# Patient Record
Sex: Female | Born: 1980
Health system: Southern US, Community
[De-identification: ages and names within clinical notes are randomized; demographics above are authoritative.]

## PROBLEM LIST (undated history)

## (undated) DIAGNOSIS — N946 Dysmenorrhea, unspecified: Secondary | ICD-10-CM

## (undated) DIAGNOSIS — E669 Obesity, unspecified: Secondary | ICD-10-CM

## (undated) DIAGNOSIS — M419 Scoliosis, unspecified: Secondary | ICD-10-CM

## (undated) DIAGNOSIS — F419 Anxiety disorder, unspecified: Secondary | ICD-10-CM

## (undated) DIAGNOSIS — E782 Mixed hyperlipidemia: Secondary | ICD-10-CM

## (undated) DIAGNOSIS — R519 Headache, unspecified: Secondary | ICD-10-CM

## (undated) DIAGNOSIS — F32A Depression, unspecified: Secondary | ICD-10-CM

## (undated) HISTORY — PX: BACK SURGERY: SHX140

## (undated) HISTORY — PX: OTHER SURGICAL HISTORY: SHX169

---
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
## 2011-09-27 ENCOUNTER — Emergency Department (HOSPITAL_COMMUNITY)
Admission: EM | Admit: 2011-09-27 | Discharge: 2011-09-27 | Disposition: A | Discharge status: Home / Self Care | Payer: Commercial Managed Care - PPO | Source: Home / Self Care | Attending: Family Medicine | Admitting: Family Medicine

## 2011-09-27 ENCOUNTER — Encounter (HOSPITAL_COMMUNITY): Payer: Self-pay | Admitting: *Deleted

## 2011-09-27 DIAGNOSIS — N390 Urinary tract infection, site not specified: Secondary | ICD-10-CM

## 2011-09-27 DIAGNOSIS — N760 Acute vaginitis: Secondary | ICD-10-CM

## 2011-09-27 HISTORY — DX: Scoliosis, unspecified: M41.9

## 2011-09-27 LAB — POCT URINALYSIS DIP (DEVICE)
Bilirubin Urine: NEGATIVE
Ketones, ur: NEGATIVE mg/dL
Specific Gravity, Urine: 1.015 (ref 1.005–1.030)
pH: 8 (ref 5.0–8.0)

## 2011-09-27 MED ORDER — AZITHROMYCIN 250 MG PO TABS
1000.0000 mg | ORAL_TABLET | Freq: Once | ORAL | Status: AC
  Administered 2011-09-27: 1000 mg via ORAL

## 2011-09-27 MED ORDER — AZITHROMYCIN 250 MG PO TABS
ORAL_TABLET | ORAL | Status: AC
  Filled 2011-09-27: qty 4

## 2011-09-27 MED ORDER — LIDOCAINE HCL (PF) 1 % IJ SOLN
INTRAMUSCULAR | Status: AC
  Filled 2011-09-27: qty 5

## 2011-09-27 MED ORDER — CEFTRIAXONE SODIUM 1 G IJ SOLR
INTRAMUSCULAR | Status: AC
  Filled 2011-09-27: qty 10

## 2011-09-27 MED ORDER — CEFTRIAXONE SODIUM 250 MG IJ SOLR
INTRAMUSCULAR | Status: AC
  Filled 2011-09-27: qty 250

## 2011-09-27 MED ORDER — METRONIDAZOLE 500 MG PO TABS: 500.0000 mg | ORAL_TABLET | Freq: Two times a day (BID) | ORAL | Status: AC

## 2011-09-27 MED ORDER — CEFTRIAXONE SODIUM 1 G IJ SOLR
1.0000 g | Freq: Once | INTRAMUSCULAR | Status: AC
  Administered 2011-09-27: 1 g via INTRAMUSCULAR

## 2011-09-27 NOTE — ED Notes (Signed)
Pt with onset of pain with urination x 3 days - vaginal discharge and itching x one week

## 2011-09-27 NOTE — Discharge Instructions (Signed)
No intercourse x 10 days. Cranberry juice recommended. Avoid caffeine products. Bacterial Vaginosis Bacterial vaginosis (BV) is a vaginal infection where the normal balance of bacteria in the vagina is disrupted. The normal balance is then replaced by an overgrowth of certain bacteria. There are several different kinds of bacteria that can cause BV. BV is the most common vaginal infection in women of childbearing age. CAUSES   The cause of BV is not fully understood. BV develops when there is an increase or imbalance of harmful bacteria.   Some activities or behaviors can upset the normal balance of bacteria in the vagina and put women at increased risk including:   Having a new sex partner or multiple sex partners.   Douching.   Using an intrauterine device (IUD) for contraception.   It is not clear what role sexual activity plays in the development of BV. However, women that have never had sexual intercourse are rarely infected with BV.  Women do not get BV from toilet seats, bedding, swimming pools or from touching objects around them.  SYMPTOMS   Grey vaginal discharge.   A fish-like odor with discharge, especially after sexual intercourse.   Itching or burning of the vagina and vulva.   Burning or pain with urination.   Some women have no signs or symptoms at all.  DIAGNOSIS  Your caregiver must examine the vagina for signs of BV. Your caregiver will perform lab tests and look at the sample of vaginal fluid through a microscope. They will look for bacteria and abnormal cells (clue cells), a pH test higher than 4.5, and a positive amine test all associated with BV.  RISKS AND COMPLICATIONS   Pelvic inflammatory disease (PID).   Infections following gynecology surgery.   Developing HIV.   Developing herpes virus.  TREATMENT  Sometimes BV will clear up without treatment. However, all women with symptoms of BV should be treated to avoid complications, especially if gynecology  surgery is planned. Female partners generally do not need to be treated. However, BV may spread between female sex partners so treatment is helpful in preventing a recurrence of BV.   BV may be treated with antibiotics. The antibiotics come in either pill or vaginal cream forms. Either can be used with nonpregnant or pregnant women, but the recommended dosages differ. These antibiotics are not harmful to the baby.   BV can recur after treatment. If this happens, a second round of antibiotics will often be prescribed.   Treatment is important for pregnant women. If not treated, BV can cause a premature delivery, especially for a pregnant woman who had a premature birth in the past. All pregnant women who have symptoms of BV should be checked and treated.   For chronic reoccurrence of BV, treatment with a type of prescribed gel vaginally twice a week is helpful.  HOME CARE INSTRUCTIONS   Finish all medication as directed by your caregiver.   Do not have sex until treatment is completed.   Tell your sexual partner that you have a vaginal infection. They should see their caregiver and be treated if they have problems, such as a mild rash or itching.   Practice safe sex. Use condoms. Only have 1 sex partner.  PREVENTION  Basic prevention steps can help reduce the risk of upsetting the natural balance of bacteria in the vagina and developing BV:  Do not have sexual intercourse (be abstinent).   Do not douche.   Use all of the medicine prescribed for  treatment of BV, even if the signs and symptoms go away.   Tell your sex partner if you have BV. That way, they can be treated, if needed, to prevent reoccurrence.  SEEK MEDICAL CARE IF:   Your symptoms are not improving after 3 days of treatment.   You have increased discharge, pain, or fever.  MAKE SURE YOU:   Understand these instructions.   Will watch your condition.   Will get help right away if you are not doing well or get worse.    FOR MORE INFORMATION  Division of STD Prevention (DSTDP), Centers for Disease Control and Prevention: [employer] American Social Health Association (ASHA): www.ashastd.org  Document Released: 07/21/2005 Document Revised: 04/02/2011 Document Reviewed: 01/11/2009 [facility] Patient Information 2012 [place], [place].

## 2011-09-27 NOTE — ED Provider Notes (Signed)
History     CSN: [id number]  Arrival date & time 09/27/11  1050   First MD Initiated Contact with Patient 09/27/11 1144      Chief Complaint  Patient presents with  . Vaginal Discharge  . Vaginal Itching  . Dysuria    (Consider location/radiation/quality/duration/timing/severity/associated sxs/prior treatment) HPI Comments: The patient reports having painful urination x 3 days. Frequency as well. Admits to having a vaginal discharge for 1-2 wks. Has hx of chlamydia and bv. Denies pregnancy. No fever. No n/v. No back pain.   The history is provided by the patient.    Past Medical History  Diagnosis Date  . Scoliosis     Past Surgical History  Procedure Date  . Scoli   . Back surgery     History reviewed. No pertinent family history.  History  Substance Use Topics  . Smoking status: Never Smoker   . Smokeless tobacco: Not on file  . Alcohol Use: No    OB History    Grav Para Term Preterm Abortions TAB SAB Ect Mult Living                  Review of Systems  Constitutional: Negative.   HENT: Negative.   Cardiovascular: Negative.   Gastrointestinal: Negative.   Genitourinary: Positive for dysuria, urgency and vaginal discharge.  Musculoskeletal: Negative.     Allergies  Levaquin  Home Medications   Current Outpatient Rx  Name Route Sig Dispense Refill  . METRONIDAZOLE 500 MG PO TABS Oral Take 1 tablet (500 mg total) by mouth 2 (two) times daily. 14 tablet 0    BP 127/84  Pulse 71  Temp(Src) 98.6 F (37 C) (Oral)  Resp 16  SpO2 98%  Physical Exam  Nursing note and vitals reviewed. Constitutional: She appears well-developed and well-nourished.  HENT:  Head: Normocephalic and atraumatic.  Cardiovascular: Normal rate and regular rhythm.   Pulmonary/Chest: Effort normal and breath sounds normal.  Abdominal: Soft. There is no tenderness.  Genitourinary:       Pelvic exam with female nursing personal karen assisting reveals no skin or vulvar  lesions. Thick white discharge noted. No cmt. Samples collected.     ED Course  Procedures (including critical care time)  Labs Reviewed  POCT URINALYSIS DIP (DEVICE) - Abnormal; Notable for the following:    Hgb urine dipstick TRACE (*)    Leukocytes, UA SMALL (*) Biochemical Testing Only. Please order routine urinalysis from main lab if confirmatory testing is needed.   All other components within normal limits  POCT PREGNANCY, URINE  URINE CULTURE  WET PREP, GENITAL  GC/CHLAMYDIA PROBE AMP, GENITAL   No results found.   1. UTI (lower urinary tract infection)   2. Vaginitis       MDM          Randa Spike, MD 09/27/11 1316

## 2011-09-28 ENCOUNTER — Telehealth (HOSPITAL_COMMUNITY): Payer: Self-pay | Admitting: Family Medicine

## 2011-09-28 NOTE — ED Notes (Signed)
Patient is requesting work note stating she was seen yesterday.  Pt request we fax note to [phone].

## 2011-09-28 NOTE — ED Notes (Signed)
I attempted to call patient back at [phone] and did not get an answer.  A voicemail was left

## 2011-09-29 LAB — URINE CULTURE: Culture  Setup Time: 201302231849

## 2011-09-29 LAB — GC/CHLAMYDIA PROBE AMP, GENITAL
Chlamydia, DNA Probe: NEGATIVE
GC Probe Amp, Genital: NEGATIVE

## 2011-09-30 ENCOUNTER — Telehealth (HOSPITAL_COMMUNITY): Payer: Self-pay | Admitting: *Deleted

## 2011-09-30 NOTE — ED Notes (Signed)
GC/Chlamydia neg., Wet prep: Mod. clue cells, mod. WBC's, Urine culture: >100,000 colonies Citrobacter Koseri.  Order obtained from Langston Masker PA for Bactrim DS # 14 1 BID. I called and left message for pt. to call. Pt. called back and verified x 2. Pt. given lab results and told she needs a Rx.  Pt. wants Rx. called to [facility] on [address] Rx. called to voicemail @ [phone]. Vassie Moselle 09/30/2011\

## 2011-10-14 ENCOUNTER — Emergency Department (INDEPENDENT_AMBULATORY_CARE_PROVIDER_SITE_OTHER)
Admission: EM | Admit: 2011-10-14 | Discharge: 2011-10-14 | Disposition: A | Discharge status: Home Health | Payer: 59 | Source: Home / Self Care | Attending: Emergency Medicine | Admitting: Emergency Medicine

## 2011-10-14 ENCOUNTER — Encounter (HOSPITAL_COMMUNITY): Payer: Self-pay

## 2011-10-14 DIAGNOSIS — L509 Urticaria, unspecified: Secondary | ICD-10-CM

## 2011-10-14 MED ORDER — PREDNISONE 5 MG PO KIT: 1.0000 | PACK | Freq: Every day | ORAL | Status: DC

## 2011-10-14 MED ORDER — METHYLPREDNISOLONE ACETATE PF 80 MG/ML IJ SUSP
80.0000 mg | Freq: Once | INTRAMUSCULAR | Status: AC
  Administered 2011-10-14: 80 mg via INTRAMUSCULAR

## 2011-10-14 MED ORDER — CIMETIDINE 400 MG PO TABS: 400.0000 mg | ORAL_TABLET | Freq: Two times a day (BID) | ORAL | Status: DC

## 2011-10-14 MED ORDER — FEXOFENADINE HCL 180 MG PO TABS: 180.0000 mg | ORAL_TABLET | Freq: Every day | ORAL | Status: DC

## 2011-10-14 MED ORDER — METHYLPREDNISOLONE ACETATE 80 MG/ML IJ SUSP
INTRAMUSCULAR | Status: AC
  Filled 2011-10-14: qty 1

## 2011-10-14 NOTE — Discharge Instructions (Signed)
Hives Hives (urticaria) are itchy, red, swollen patches on the skin. They may change size, shape, and location quickly and repeatedly. Hives that occur deeper in the skin can cause swelling of the hands, feet, and face. Hives may be an allergic reaction to something you or your child ate, touched, or put on the skin. Hives can also be a reaction to cold, heat, viral infections, medication, insect bites, or emotional stress. Often the cause is hard to find. Hives can come and go for several days to several weeks. Hives are not contagious. HOME CARE INSTRUCTIONS   If the cause of the hives is known, avoid exposure to that source.   To relieve itching and rash:   Apply cold compresses to the skin or take cool water baths. Do not take or give your child hot baths or showers because the warmth will make the itching worse.   The best medicine for hives is an antihistamine. An antihistamine will not cure hives, but it will reduce their severity. You can use an antihistamine available over the counter. This medicine may make your child sleepy. Teenagers should not drive while using this medicine.   Take or give an antihistamine every 6 hours until the hives are completely gone for 24 hours or as directed.   Your child may have other medications prescribed for itching. Give these as directed by your child's caregiver.   You or your child should wear loose fitting clothing, including undergarments. Skin irritations may make hives worse.   Follow-up as directed by your caregiver.  SEEK MEDICAL CARE IF:   You or your child still have considerable itching after taking the medication (prescribed or purchased over the counter).   Joint swelling or pain occurs.  SEEK IMMEDIATE MEDICAL CARE IF:   You have a fever.   Swollen lips or tongue are noticed.   There is difficulty with breathing, swallowing, or tightness in the throat or chest.   Abdominal pain develops.   Your child starts acting very  sick.  These may be the first signs of a life-threatening allergic reaction. THIS IS AN EMERGENCY. Call 911 for medical help. MAKE SURE YOU:   Understand these instructions.   Will watch your condition.   Will get help right away if you are not doing well or get worse.  Document Released: 07/21/2005 Document Revised: 07/10/2011 Document Reviewed: 03/10/2008 [employer] Patient Information 2012 [employer]. 

## 2011-10-14 NOTE — ED Provider Notes (Signed)
Chief Complaint  Patient presents with  . Rash    History of Present Illness:   Caitlin Foster is a 31 year old female who just finished up a course of trimethoprim sulfamethoxazole 5 days ago for urinary tract infection. 2 days after finishing this up she began to develop itching of her arms legs trunk and face. Yesterday she broke out in an urticarial-like rash on her arms legs and buttocks. Denies any difficulty breathing, wheezing, or chest tightness. She's had no swelling of her lips tongue or throat. No prior history of allergic reaction to Bactrim. She is allergic to Levaquin but she's not taken this recently.  Review of Systems:  Other than noted above, the patient denies any of the following symptoms: Systemic:  No fever, chills, sweats, weight loss, or fatigue. ENT:  No nasal congestion, rhinorrhea, sore throat, swelling of lips, tongue or throat. Resp:  No cough, wheezing, or shortness of breath. Skin:  No rash, itching, nodules, or suspicious lesions.  PMFSH:  Past medical history, family history, social history, meds, and allergies were reviewed.  Physical Exam:   Vital signs:  BP 107/70  Pulse 72  Temp(Src) 99.3 F (37.4 C) (Oral)  Resp 20  SpO2 98%  LMP 09/19/2011 Gen:  Alert, oriented, in no distress. Skin:  She has urticarial-like rash on her arms and legs. None of her face or trunk. ENT: Pharynx was clear and there are no intraoral lesions, no swelling of the lips, tongue, or throat. Lungs: Clear to auscultation without wheezes, rales, rhonchi. Heart: Regular rhythm, no gallops or murmurs.  Course in Urgent Care Center:   She was given Depo-Medrol 80 mg IM and tolerated this well without any immediate side effects.  Assessment:   Diagnoses that have been ruled out:  None  Diagnoses that are still under consideration:  None  Final diagnoses:  Urticaria    Plan:   1.  The following meds were prescribed:   New Prescriptions   CIMETIDINE (TAGAMET) 400 MG TABLET     Take 1 tablet (400 mg total) by mouth 2 (two) times daily.   FEXOFENADINE (ALLEGRA) 180 MG TABLET    Take 1 tablet (180 mg total) by mouth daily.   PREDNISONE 5 MG KIT    Take 1 kit (5 mg total) by mouth daily after breakfast. Prednisone 5 mg 6 day dosepack.  Take as directed.   2.  The patient was instructed in symptomatic care and handouts were given. 3.  The patient was told to return if becoming worse in any way, if no better in 3 or 4 days, and given some red flag symptoms that would indicate earlier return. The patient was told not to take any further trimethoprim sulfa, Septra, Bactrim, or any other medication containing sulfa in the future. Notation was made in her chart that she is allergic to trimethoprim sulfa.     Reuben Likes, MD 10/14/11 [phone]

## 2011-10-14 NOTE — ED Notes (Signed)
Pt states she began itching all over on Sunday and the hives appeared yesterday.  Reports taking benadryl without relief.  States she just completed course of bactrim on Friday but otherwise no new medications or hygiene products.

## 2012-07-08 ENCOUNTER — Ambulatory Visit (INDEPENDENT_AMBULATORY_CARE_PROVIDER_SITE_OTHER): Payer: 59 | Admitting: Radiation Oncology

## 2012-07-08 ENCOUNTER — Encounter: Payer: Self-pay | Admitting: Radiation Oncology

## 2012-07-08 ENCOUNTER — Ambulatory Visit: Payer: 59 | Admitting: Internal Medicine

## 2012-07-08 VITALS — BP 128/76 | HR 64 | Temp 99.8°F | Ht 66.0 in | Wt 181.7 lb

## 2012-07-08 DIAGNOSIS — R5381 Other malaise: Secondary | ICD-10-CM

## 2012-07-08 DIAGNOSIS — R5383 Other fatigue: Secondary | ICD-10-CM

## 2012-07-08 DIAGNOSIS — L659 Nonscarring hair loss, unspecified: Secondary | ICD-10-CM

## 2012-07-08 LAB — BASIC METABOLIC PANEL
CO2: 31 mEq/L (ref 19–32)
Calcium: 9.5 mg/dL (ref 8.4–10.5)
Chloride: 104 mEq/L (ref 96–112)
Sodium: 140 mEq/L (ref 135–145)

## 2012-07-08 LAB — CBC
HCT: 36.5 % (ref 36.0–46.0)
Hemoglobin: 12.4 g/dL (ref 12.0–15.0)
MCH: 30 pg (ref 26.0–34.0)
MCHC: 34 g/dL (ref 30.0–36.0)
RBC: 4.14 MIL/uL (ref 3.87–5.11)

## 2012-07-08 LAB — TSH: TSH: 1.573 u[IU]/mL (ref 0.350–4.500)

## 2012-07-08 NOTE — Patient Instructions (Signed)
You will be contacted regarding the results of your laboratory work after results are made available. Any changes made to your medications will be addressed after these results return. Have a great day.

## 2012-07-09 ENCOUNTER — Ambulatory Visit: Payer: 59 | Admitting: Internal Medicine

## 2012-07-09 DIAGNOSIS — L659 Nonscarring hair loss, unspecified: Secondary | ICD-10-CM | POA: Insufficient documentation

## 2012-07-09 NOTE — Progress Notes (Signed)
  Subjective:    Patient ID: Caitlin Foster, female    DOB: 23-Nov-1980, 31 y.o.   MRN: [id number]  HPI Pt is a 31 yo woman who presents to clinic for evaluation for hair loss x 2 months. Pt states that she has noticed losing 8-10 hairs during her showers, when previously she lost only 2-3. She denies any scalp itching or redness. She denies any significant scalp flaking. She does not take any medications. She denies any patches of alopecia and denies any hair breakage above the hair root. She denies any localization of the hair loss and states it is generalized. She states she noticed that it may have become slightly thinner over the last 2 months as well. She has no PMH. Pt complains also of mild fatigue, 6lb weight gain, and occasional irritability over the last 2 months, and states she read on the internet that all of her symptoms could be caused by hypothyroidism, so she requests to have her thyroid function tested. She denies insomnia (or early morning awakenings), anhedonia, changes in concentration, or feelings of depression. She states she otherwise feels well and has no other complaints.    Review of Systems  Constitutional: Positive for fatigue and unexpected weight change.  HENT: Negative.   Eyes: Negative.   Respiratory: Negative for shortness of breath and wheezing.   Cardiovascular: Negative for chest pain and leg swelling.  Gastrointestinal: Negative for nausea, vomiting, abdominal pain and diarrhea.  Genitourinary: Negative.   Musculoskeletal: Negative.   Skin: Negative for rash and wound.       Diffuse hair loss  Neurological: Negative.   Hematological: Negative.   Psychiatric/Behavioral: Negative.        Objective:   Physical Exam  Constitutional: She is oriented to person, place, and time. She appears well-developed and well-nourished. No distress.  HENT:  Head: Normocephalic and atraumatic.  Mouth/Throat: Oropharynx is clear and moist.  Eyes: Conjunctivae normal are  normal. Pupils are equal, round, and reactive to light. No scleral icterus.  Neck: Normal range of motion. Neck supple. No tracheal deviation present. No thyromegaly present.  Cardiovascular: Normal rate and regular rhythm.   No murmur heard. Pulmonary/Chest: Effort normal and breath sounds normal. She has no wheezes. She has no rales.  Abdominal: Soft. Bowel sounds are normal. She exhibits no distension. There is no tenderness.  Musculoskeletal: Normal range of motion. She exhibits no edema.  Neurological: She is alert and oriented to person, place, and time. No cranial nerve deficit.  Skin: Skin is warm and dry. No rash noted.       No inflammation of scalp. No flaking. No focal alopecia.          Assessment & Plan:

## 2012-07-09 NOTE — Addendum Note (Signed)
Addended by: Elenor Legato R on: 07/09/2012 04:59 PM   Modules accepted: Orders

## 2012-11-08 NOTE — Addendum Note (Signed)
Addended by: Neomia Dear on: 11/08/2012 04:37 PM   Modules accepted: Orders

## 2013-05-09 ENCOUNTER — Telehealth: Payer: Self-pay | Admitting: *Deleted

## 2013-05-09 NOTE — Telephone Encounter (Signed)
Caitlin Foster called [facility] and confirmed pt no-showed her Jan. 9, 2014 appt for hair loss. Documented by Dorie Rank, RN, 05/09/13, 11:57A

## 2014-05-11 ENCOUNTER — Other Ambulatory Visit (HOSPITAL_COMMUNITY)
Admission: RE | Admit: 2014-05-11 | Discharge: 2014-05-11 | Disposition: A | Discharge status: Home / Self Care | Payer: 59 | Source: Ambulatory Visit | Attending: Emergency Medicine | Admitting: Emergency Medicine

## 2014-05-11 ENCOUNTER — Emergency Department (HOSPITAL_COMMUNITY)
Admission: EM | Admit: 2014-05-11 | Discharge: 2014-05-11 | Disposition: A | Discharge status: Home / Self Care | Payer: 59 | Source: Home / Self Care | Attending: Emergency Medicine | Admitting: Emergency Medicine

## 2014-05-11 ENCOUNTER — Encounter (HOSPITAL_COMMUNITY): Payer: Self-pay | Admitting: Emergency Medicine

## 2014-05-11 DIAGNOSIS — Z113 Encounter for screening for infections with a predominantly sexual mode of transmission: Secondary | ICD-10-CM | POA: Diagnosis present

## 2014-05-11 DIAGNOSIS — N760 Acute vaginitis: Secondary | ICD-10-CM | POA: Insufficient documentation

## 2014-05-11 DIAGNOSIS — N390 Urinary tract infection, site not specified: Secondary | ICD-10-CM

## 2014-05-11 LAB — POCT URINALYSIS DIP (DEVICE)
Bilirubin Urine: NEGATIVE
GLUCOSE, UA: NEGATIVE mg/dL
Ketones, ur: NEGATIVE mg/dL
NITRITE: POSITIVE — AB
Protein, ur: NEGATIVE mg/dL
SPECIFIC GRAVITY, URINE: 1.01 (ref 1.005–1.030)
UROBILINOGEN UA: 0.2 mg/dL (ref 0.0–1.0)
pH: 5.5 (ref 5.0–8.0)

## 2014-05-11 LAB — POCT PREGNANCY, URINE: PREG TEST UR: NEGATIVE

## 2014-05-11 MED ORDER — METRONIDAZOLE 500 MG PO TABS: 500.0000 mg | ORAL_TABLET | Freq: Two times a day (BID) | ORAL | Status: DC

## 2014-05-11 MED ORDER — CEPHALEXIN 500 MG PO CAPS: 500.0000 mg | ORAL_CAPSULE | Freq: Three times a day (TID) | ORAL | Status: DC

## 2014-05-11 NOTE — Discharge Instructions (Signed)
Urinary Tract Infection Urinary tract infections (UTIs) can develop anywhere along your urinary tract. Your urinary tract is your body's drainage system for removing wastes and extra water. Your urinary tract includes two kidneys, two ureters, a bladder, and a urethra. Your kidneys are a pair of bean-shaped organs. Each kidney is about the size of your fist. They are located below your ribs, one on each side of your spine. CAUSES Infections are caused by microbes, which are microscopic organisms, including fungi, viruses, and bacteria. These organisms are so small that they can only be seen through a microscope. Bacteria are the microbes that most commonly cause UTIs. SYMPTOMS  Symptoms of UTIs may vary by age and gender of the patient and by the location of the infection. Symptoms in young women typically include a frequent and intense urge to urinate and a painful, burning feeling in the bladder or urethra during urination. Older women and men are more likely to be tired, shaky, and weak and have muscle aches and abdominal pain. A fever may mean the infection is in your kidneys. Other symptoms of a kidney infection include pain in your back or sides below the ribs, nausea, and vomiting. DIAGNOSIS To diagnose a UTI, your caregiver will ask you about your symptoms. Your caregiver also will ask to provide a urine sample. The urine sample will be tested for bacteria and white blood cells. White blood cells are made by your body to help fight infection. TREATMENT  Typically, UTIs can be treated with medication. Because most UTIs are caused by a bacterial infection, they usually can be treated with the use of antibiotics. The choice of antibiotic and length of treatment depend on your symptoms and the type of bacteria causing your infection. HOME CARE INSTRUCTIONS  If you were prescribed antibiotics, take them exactly as your caregiver instructs you. Finish the medication even if you feel better after you  have only taken some of the medication.  Drink enough water and fluids to keep your urine clear or pale yellow.  Avoid caffeine, tea, and carbonated beverages. They tend to irritate your bladder.  Empty your bladder often. Avoid holding urine for long periods of time.  Empty your bladder before and after sexual intercourse.  After a bowel movement, women should cleanse from front to back. Use each tissue only once. SEEK MEDICAL CARE IF:   You have back pain.  You develop a fever.  Your symptoms do not begin to resolve within 3 days. SEEK IMMEDIATE MEDICAL CARE IF:   You have severe back pain or lower abdominal pain.  You develop chills.  You have nausea or vomiting.  You have continued burning or discomfort with urination. MAKE SURE YOU:   Understand these instructions.  Will watch your condition.  Will get help right away if you are not doing well or get worse. Document Released: 04/30/2005 Document Revised: 01/20/2012 Document Reviewed: 08/29/2011 [facility] Patient Information 2015 [place], [place]. This information is not intended to replace advice given to you by your health care provider. Make sure you discuss any questions you have with your health care provider.  Vaginitis Vaginitis is an inflammation of the vagina. It is most often caused by a change in the normal balance of the bacteria and yeast that live in the vagina. This change in balance causes an overgrowth of certain bacteria or yeast, which causes the inflammation. There are different types of vaginitis, but the most common types are:  Bacterial vaginosis.  Yeast infection (candidiasis).  Trichomoniasis vaginitis. This is a sexually transmitted infection (STI).  Viral vaginitis.  Atropic vaginitis.  Allergic vaginitis. CAUSES  The cause depends on the type of vaginitis. Vaginitis can be caused by:  Bacteria (bacterial vaginosis).  Yeast (yeast infection).  A parasite (trichomoniasis  vaginitis)  A virus (viral vaginitis).  Low hormone levels (atrophic vaginitis). Low hormone levels can occur during pregnancy, breastfeeding, or after menopause.  Irritants, such as bubble baths, scented tampons, and feminine sprays (allergic vaginitis). Other factors can change the normal balance of the yeast and bacteria that live in the vagina. These include:  Antibiotic medicines.  Poor hygiene.  Diaphragms, vaginal sponges, spermicides, birth control pills, and intrauterine devices (IUD).  Sexual intercourse.  Infection.  Uncontrolled diabetes.  A weakened immune system. SYMPTOMS  Symptoms can vary depending on the cause of the vaginitis. Common symptoms include:  Abnormal vaginal discharge.  The discharge is white, gray, or yellow with bacterial vaginosis.  The discharge is thick, white, and cheesy with a yeast infection.  The discharge is frothy and yellow or greenish with trichomoniasis.  A bad vaginal odor.  The odor is fishy with bacterial vaginosis.  Vaginal itching, pain, or swelling.  Painful intercourse.  Pain or burning when urinating. Sometimes, there are no symptoms. TREATMENT  Treatment will vary depending on the type of infection.   Bacterial vaginosis and trichomoniasis are often treated with antibiotic creams or pills.  Yeast infections are often treated with antifungal medicines, such as vaginal creams or suppositories.  Viral vaginitis has no cure, but symptoms can be treated with medicines that relieve discomfort. Your sexual partner should be treated as well.  Atrophic vaginitis may be treated with an estrogen cream, pill, suppository, or vaginal ring. If vaginal dryness occurs, lubricants and moisturizing creams may help. You may be told to avoid scented soaps, sprays, or douches.  Allergic vaginitis treatment involves quitting the use of the product that is causing the problem. Vaginal creams can be used to treat the symptoms. HOME  CARE INSTRUCTIONS   Take all medicines as directed by your caregiver.  Keep your genital area clean and dry. Avoid soap and only rinse the area with water.  Avoid douching. It can remove the healthy bacteria in the vagina.  Do not use tampons or have sexual intercourse until your vaginitis has been treated. Use sanitary pads while you have vaginitis.  Wipe from front to back. This avoids the spread of bacteria from the rectum to the vagina.  Let air reach your genital area.  Wear cotton underwear to decrease moisture buildup.  Avoid wearing underwear while you sleep until your vaginitis is gone.  Avoid tight pants and underwear or nylons without a cotton panel.  Take off wet clothing (especially bathing suits) as soon as possible.  Use mild, non-scented products. Avoid using irritants, such as:  Scented feminine sprays.  Fabric softeners.  Scented detergents.  Scented tampons.  Scented soaps or bubble baths.  Practice safe sex and use condoms. Condoms may prevent the spread of trichomoniasis and viral vaginitis. SEEK MEDICAL CARE IF:   You have abdominal pain.  You have a fever or persistent symptoms for more than 2-3 days.  You have a fever and your symptoms suddenly get worse. Document Released: 05/18/2007 Document Revised: 04/14/2012 Document Reviewed: 01/01/2012 [facility] Patient Information 2015 [place], [place]. This information is not intended to replace advice given to you by your health care provider. Make sure you discuss any questions you have with your health care  provider.

## 2014-05-11 NOTE — ED Provider Notes (Signed)
Chief Complaint   Urinary Tract Infection   History of Present Illness   Caitlin Foster is a 33 year old female who has had a 3 to four-week history of a sensation of bladder fullness and pressure, frequency, no dysuria and, and urgency. She has seen some blood in her urine. She's had a low-grade temperature of 99.8. She's also had some vaginal discharge, itching, and older. Her last menstrual period began 6 days ago. She had a urinary tract infection year ago and took Bactrim for this but she developed an allergic reaction to it. She has a history of bacterial vaginosis, Trichomonas, and yeast. She denies any abdominal pain, back pain, chills, nausea, or vomiting. She has a new sexual partner about 2 weeks ago. She has a concerned about STDs.  Review of Systems   Other than as noted above, the patient denies any of the following symptoms: Systemic:  No fever or chills GI:  No abdominal pain, nausea, vomiting, diarrhea, constipation, melena or hematochezia. GU:  No dysuria, frequency, urgency, hematuria, vaginal discharge, itching, or abnormal vaginal bleeding.  [place]   Past medical history, family history, social history, meds, and allergies were reviewed.  She's allergic to levofloxacin and Bactrim.  Physical Examination    Vital signs:  BP 119/81  Pulse 88  Temp(Src) 98.1 F (36.7 C) (Oral)  Resp 18  SpO2 97%  LMP 05/04/2014 General:  Alert, oriented and in no distress. Lungs:  Breath sounds clear and equal bilaterally.  No wheezes, rales or rhonchi. Heart:  Regular rhythm.  No gallops or murmers. Abdomen:  Soft, flat and non-distended.  No organomegaly or mass.  No tenderness, guarding or rebound.  Bowel sounds normally active. Pelvic exam:  Normal external genitalia. She has a thick, tan, frothy, malodorous vaginal discharge. No cervical motion pain. Uterus is normal in size and shape and nontender. No adnexal tenderness or mass.  DNA probes for gonorrhea, Chlamydia,  Trichomonas, Gardnerella, Candida were obtained. Skin:  Clear, warm and dry.  Chaperoned by Cheral Bay, RN who was present throughout the pelvic exam.   Labs   Results for orders placed during the hospital encounter of 05/11/14  POCT URINALYSIS DIP (DEVICE)      Result Value Ref Range   Glucose, UA NEGATIVE  NEGATIVE mg/dL   Bilirubin Urine NEGATIVE  NEGATIVE   Ketones, ur NEGATIVE  NEGATIVE mg/dL   Specific Gravity, Urine 1.010  1.005 - 1.030   Hgb urine dipstick SMALL (*) NEGATIVE   pH 5.5  5.0 - 8.0   Protein, ur NEGATIVE  NEGATIVE mg/dL   Urobilinogen, UA 0.2  0.0 - 1.0 mg/dL   Nitrite POSITIVE (*) NEGATIVE   Leukocytes, UA LARGE (*) NEGATIVE  POCT PREGNANCY, URINE      Result Value Ref Range   Preg Test, Ur NEGATIVE  NEGATIVE    Urine was cultured.  Assessment   The primary encounter diagnosis was Vaginitis. A diagnosis of UTI (lower urinary tract infection) was also pertinent to this visit.       Plan    1.  Meds:  The following meds were prescribed:   Discharge Medication List as of 05/11/2014  6:59 PM    START taking these medications   Details  cephALEXin (KEFLEX) 500 MG capsule Take 1 capsule (500 mg total) by mouth 3 (three) times daily., Starting 05/11/2014, Until Discontinued, Normal    metroNIDAZOLE (FLAGYL) 500 MG tablet Take 1 tablet (500 mg total) by mouth 2 (two) times daily., Starting 05/11/2014, Until  Discontinued, Normal        2.  Patient Education/Counseling:  The patient was given appropriate handouts, self care instructions, and instructed in symptomatic relief.    3.  Follow up:  The patient was told to follow up here if no better in 3 to 4 days, or sooner if becoming worse in any way, and given some red flag symptoms such as worsening pain, fever, persistent vomiting, or heavy vaginal bleeding which would prompt immediate return.       Harden Mo, MD 05/11/14 [phone]

## 2014-05-11 NOTE — ED Notes (Addendum)
Patient reports she is concerned for bacterial vaginosis or a uti.  Reports urine is dark/concentrated, denies burning. Patient wants to be checked for bv

## 2014-05-12 LAB — CERVICOVAGINAL ANCILLARY ONLY
Chlamydia: NEGATIVE
NEISSERIA GONORRHEA: NEGATIVE

## 2014-05-13 LAB — URINE CULTURE: Special Requests: NORMAL

## 2014-05-14 ENCOUNTER — Emergency Department (HOSPITAL_COMMUNITY): Payer: 59

## 2014-05-14 ENCOUNTER — Telehealth (HOSPITAL_COMMUNITY): Payer: Self-pay | Admitting: Emergency Medicine

## 2014-05-14 ENCOUNTER — Encounter (HOSPITAL_COMMUNITY): Payer: Self-pay | Admitting: Emergency Medicine

## 2014-05-14 DIAGNOSIS — Z8739 Personal history of other diseases of the musculoskeletal system and connective tissue: Secondary | ICD-10-CM | POA: Insufficient documentation

## 2014-05-14 DIAGNOSIS — Z8709 Personal history of other diseases of the respiratory system: Secondary | ICD-10-CM | POA: Insufficient documentation

## 2014-05-14 DIAGNOSIS — R079 Chest pain, unspecified: Secondary | ICD-10-CM | POA: Diagnosis present

## 2014-05-14 DIAGNOSIS — Z87891 Personal history of nicotine dependence: Secondary | ICD-10-CM | POA: Insufficient documentation

## 2014-05-14 DIAGNOSIS — R05 Cough: Secondary | ICD-10-CM | POA: Diagnosis not present

## 2014-05-14 DIAGNOSIS — Z792 Long term (current) use of antibiotics: Secondary | ICD-10-CM | POA: Insufficient documentation

## 2014-05-14 LAB — BASIC METABOLIC PANEL
ANION GAP: 12 (ref 5–15)
BUN: 12 mg/dL (ref 6–23)
CO2: 27 meq/L (ref 19–32)
CREATININE: 0.9 mg/dL (ref 0.50–1.10)
Calcium: 9.7 mg/dL (ref 8.4–10.5)
Chloride: 100 mEq/L (ref 96–112)
GFR calc Af Amer: >90 mL/min (ref >90)
GFR calc non Af Amer: 84 mL/min — ABNORMAL LOW (ref >90)
Glucose, Bld: 97 mg/dL (ref 70–99)
Potassium: 4.2 mEq/L (ref 3.7–5.3)
SODIUM: 139 meq/L (ref 137–147)

## 2014-05-14 LAB — CBC
HCT: 35.9 % — ABNORMAL LOW (ref 36.0–46.0)
Hemoglobin: 12.1 g/dL (ref 12.0–15.0)
MCH: 29.7 pg (ref 26.0–34.0)
MCHC: 33.7 g/dL (ref 30.0–36.0)
MCV: 88 fL (ref 78.0–100.0)
Platelets: 340 10*3/uL (ref 150–400)
RBC: 4.08 MIL/uL (ref 3.87–5.11)
RDW: 12.7 % (ref 11.5–15.5)
WBC: 6.6 10*3/uL (ref 4.0–10.5)

## 2014-05-14 LAB — I-STAT TROPONIN, ED: TROPONIN I, POC: 0 ng/mL (ref 0.00–0.08)

## 2014-05-14 LAB — PRO B NATRIURETIC PEPTIDE: PRO B NATRI PEPTIDE: 29.2 pg/mL (ref 0–125)

## 2014-05-14 MED ORDER — FLUCONAZOLE 150 MG PO TABS: 150.0000 mg | ORAL_TABLET | Freq: Once | ORAL | Status: DC

## 2014-05-14 NOTE — ED Notes (Signed)
Pt. reports intermittent mid chest pain/pressure with mild SOB and dizziness onset Wednesday this week , denies nausea or diaphoresis .

## 2014-05-14 NOTE — ED Notes (Signed)
The patient was called and informed of the results of her urine culture which showed Escherichia coli sensitive to cephalosporins. She was encouraged to finish up her prescription. She is requesting a prescription for Diflucan, since she is susceptible to yeast infections. [facility] [address].  Harden Mo, MD 05/14/14 1136

## 2014-05-15 ENCOUNTER — Emergency Department (HOSPITAL_COMMUNITY)
Admission: EM | Admit: 2014-05-15 | Discharge: 2014-05-15 | Disposition: A | Discharge status: Home / Self Care | Payer: 59 | Attending: Emergency Medicine | Admitting: Emergency Medicine

## 2014-05-15 DIAGNOSIS — R079 Chest pain, unspecified: Secondary | ICD-10-CM

## 2014-05-15 LAB — CERVICOVAGINAL ANCILLARY ONLY
Wet Prep (BD Affirm): NEGATIVE
Wet Prep (BD Affirm): NEGATIVE
Wet Prep (BD Affirm): POSITIVE — AB

## 2014-05-15 MED ORDER — HYDROCODONE-ACETAMINOPHEN 5-325 MG PO TABS: 1.0000 | ORAL_TABLET | Freq: Four times a day (QID) | ORAL | Status: DC | PRN

## 2014-05-15 MED ORDER — IBUPROFEN 800 MG PO TABS: 800.0000 mg | ORAL_TABLET | Freq: Three times a day (TID) | ORAL | Status: DC | PRN

## 2014-05-15 MED ORDER — KETOROLAC TROMETHAMINE 60 MG/2ML IM SOLN
60.0000 mg | Freq: Once | INTRAMUSCULAR | Status: AC
  Administered 2014-05-15: 60 mg via INTRAMUSCULAR
  Filled 2014-05-15: qty 2

## 2014-05-15 NOTE — Discharge Instructions (Signed)
Return here as needed. Follow up with the resources provided.     Emergency Department Resource Guide 1) Find a Doctor and Pay Out of Pocket Although you won't have to find out who is covered by your insurance plan, it is a good idea to ask around and get recommendations. You will then need to call the office and see if the doctor you have chosen will accept you as a new patient and what types of options they offer for patients who are self-pay. Some doctors offer discounts or will set up payment plans for their patients who do not have insurance, but you will need to ask so you aren't surprised when you get to your appointment.  2) Contact Your Local Health Department Not all health departments have doctors that can see patients for sick visits, but many do, so it is worth a call to see if yours does. If you don't know where your local health department is, you can check in your phone book. The CDC also has a tool to help you locate your state's health department, and many state websites also have listings of all of their local health departments.  3) Find a [facility] If your illness is not likely to be very severe or complicated, you may want to try a walk in clinic. These are popping up all over the country in pharmacies, drugstores, and shopping centers. They're usually staffed by nurse practitioners or physician assistants that have been trained to treat common illnesses and complaints. They're usually fairly quick and inexpensive. However, if you have serious medical issues or chronic medical problems, these are probably not your best option.  No Primary Care Doctor: - Call Health Connect at  [phone] - they can help you locate a primary care doctor that  accepts your insurance, provides certain services, etc. - Physician Referral Service- [phone]  Chronic Pain Problems: Organization         Address  Phone   Notes  [facility]  [phone] Patients need to  be referred by their primary care doctor.   Medication Assistance: Organization         Address  Phone   Notes  [facility] Medication [facility] [address] [place], [place] [address] [phone] --Must be a resident of [facility] -- Must have NO insurance coverage whatsoever (no Medicaid/ Medicare, etc.) -- The pt. MUST have a primary care doctor that directs their care regularly and follows them in the community   Med[facility]ist  [phone]   [employer]  [phone]    Agencies that provide inexpensive medical care: Organization         Address  Phone   Notes  [place]  [phone]   [facility]    [phone]   [facility], [place] [address] [phone]   [place] [address], [place] [phone]   Planned Parenthood    [phone]   [facility]    [phone]   [place] [address], [place] Phone:  [phone], Fax:  [phone] Hours of Operation:  9 am - 6 pm, M-F.  Also accepts Medicaid/Medicare and self-pay.  [facility] for [place] [address], [address], [place] Phone: [phone], Fax: [phone]. Hours of Operation:  8:30 am -  5:30 pm, M-F.  Also accepts Medicaid and self-pay.  [facility] [place] [address] [place] Phone: [phone]   [place], [place], [place] [phone], Ext. [phone] Mondays & Thursdays: 7-9 AM.  First 15 patients are seen on a first come, first serve basis.    [place] Providers:  Organization         Address  Phone   Notes  [facility] [address], [place] [phone] Also accepts self-pay patients.  [facility] [id number] [place], Center  [phone]   [place], [address], [place] [phone]   [facility] [address], [place] [phone]   Lucianne Lei [address], [place]   [phone] Only accepts [place] Access [place] patients after they have their name applied to their card.   Self-Pay (no insurance) in [facility]:  Organization         Address  Phone   Notes  Sickle Cell Patients, [facility] Internal Medicine [place] [phone]   [facility] Urgent Care [place] [phone]   [facility] [place]  [place], [place], [place] [phone]   [facility]/Dr. Osei-Bonsu  [address] [place] or [address], [place] [phone] Phone number for both [place] and [place] locations is the same.  Urgent Medical and [facility] [address], [place] [phone]   [facility] [address] [place] or [address] [phone]   [facility] [address], [place] [phone], phone; [phone], fax Sees patients 1st and 3rd Saturday of every month.  Must not qualify for public or private insurance (i.e. Medicaid, Medicare, [place] Health Choice, Veterans' Benefits)  Household income should be no more than 200% of the poverty level The clinic cannot treat you if you are pregnant or think you are pregnant  Sexually transmitted diseases are not treated at the clinic.    Dental Care: Organization         Address  Phone  Notes  [facility] Department of [facility] [place] [phone] Accepts children up to age 25 who are enrolled in [place] or [place]; pregnant women with a Medicaid card; and children who have applied for Medicaid or [place] Health Choice, but were declined, whose parents can  pay a reduced fee at time of service.  [facility] Department of [facility]  [address], [place] [phone] Accepts children up to age 62 who are enrolled in [place] or [place]; pregnant women with a Medicaid card; and children who have applied for Medicaid or [place] Health Choice, but were declined, whose parents can pay a reduced fee at time of service.  [facility] Access PROGRAM  [place] [phone] Patients are seen by appointment only. Walk-ins are not accepted. [place] will see patients 33 years of age and older. Monday - Tuesday (8am-5pm) Most Wednesdays (8:30-5pm) $30 per visit, cash only  [facility] Adult Dental Access PROGRAM  [address], [facility] [phone] Patients are seen by appointment only. Walk-ins are  not accepted. [place] will see patients 35 years of age and older. One Wednesday Evening (Monthly: Volunteer Based).  $30 per visit, cash only  [place]  [phone] for adults; Children under age 55, call Graduate Pediatric Dentistry at [phone]. Children aged 98-14, please call [phone] to request a pediatric application.  Dental services are provided in all areas of dental care including fillings, crowns and bridges, complete and partial dentures, implants, gum treatment, root canals, and extractions. Preventive care is also provided. Treatment is provided to both adults and children. Patients are selected via a lottery and there is often a waiting list.   [facility] [address], [place]  [phone] [email or web address]   [facility] [address] [place], [place] [phone], Ext. [phone] Second and Fourth Thursday of each month, opens at 6:30 AM; Clinic ends at 9 AM.  Patients are seen on a first-come first-served basis, and a limited number are seen during each clinic.   [facility]  [address] Hillard Danker [place], [place] [phone]   Eligibility Requirements You must have lived in [place], [place], or [place] counties for at least the last three months.   You cannot be eligible for state or federal sponsored [employer], including [employer], [place], or [employer].   You generally cannot be eligible for healthcare insurance through your employer.    How to apply: Eligibility screenings are held every Tuesday and Wednesday afternoon from 1:00 pm until 4:00 pm. You do not need an appointment for the interview!  [facility] [address], [place], [place]   [place]  [place] Department  [place]  [phone]    Behavioral Health Resources in the Community: Intensive Outpatient Programs Organization         Address  Phone  Notes  [place]. [address], [place], [place] [phone]   [facility] [address] [place], [place]   ADS: [facility] Drug S[facility]cs [address], [place], [place]   [place] [address]  [place], [place] or [phone]   Substance Abuse Resources Organization         Address  Phone  Notes  Alcohol and Drug Services  [phone]   [place]  [phone]   The [place]   Chinita Pester  [phone]   Residential & Outpatient Substance Abuse Program  [phone]   Psychological Services Organization         Address  Phone  Notes  [facility] [place]  [place]  [phone]   [place] [address], [place] or [phone]    Mobile Crisis Teams Organization         Address  Phone  Notes  [facility], Mobile Crisis Care Unit  [phone]    [facility]ive Psychotherapeutic Services  [address] [place], [place]   [place] [address] [place] [phone]    Self-Help/Support Groups Organization         Address  Phone             Notes  [place]. of [place] - variety of support groups  [place] Call for more information  Narcotics Anonymous (NA),  [facility] [address], [employer]   2 meetings at this location   Residential Facilities manager         Address  Phone  Notes  ASAP Residential Treatment [place],    [place]  [phone]   [facility]  [address] [place] [phone], [place], [place]   [place], [place] [phone] Admissions: 8am-3pm M-F  Incentives Substance [place] [address],    [place], [place] [phone]   [facility] [address] [place], [place], [place]   [facility] [address],  [place], [place]   [facility] Programs - Intensive Outpatient [address], [place], [place]   [facility] ([place].) [place].,  [place], [place] [phone] or [phone]   Residential Treatment Services (RTS) [address], [place], [place] Accepts Medicaid  [facility] [place] [address]  [place] [phone] Substance Abuse/Addiction Treatment   [facility] Organization         Address  Phone  Notes  [facility]  [phone]   Domenic Schwab, PhD [address] Arlis Porta [place], [place]   [phone] or [phone]   [place], [place] [phone]   [facility] [address] [place], [place] [phone] Insurance/Medicaid/sponsorship through [facility] [place] and Families [address], [address] [place]                                     [place], [place] [phone] [place] [address], [place] [phone]    Dr. Adele Schilder  [phone]   [facility] Dept. 1) [address], [place] 2) [place] 3)  [place] [address], [place] [phone]  [phone]   [place] [phone] or [phone] (After Hours)

## 2014-05-15 NOTE — ED Provider Notes (Signed)
Medical screening examination/treatment/procedure(s) were performed by non-physician practitioner and as supervising physician I was immediately available for consultation/collaboration.   EKG Interpretation   Date/Time:  Sunday May 14 2014 22:19:35 EDT Ventricular Rate:  60 PR Interval:  162 QRS Duration: 102 QT Interval:  406 QTC Calculation: 406 R Axis:   24 Text Interpretation:  Normal sinus rhythm Incomplete right bundle branch  block Borderline ECG No prior for comparison Confirmed by Dina Rich  MD,  Dillin Lofgren ([id number]) on 05/15/2014 12:27:46 AM        Merryl Hacker, MD 05/15/14 1943

## 2014-05-15 NOTE — ED Provider Notes (Signed)
CSN: [id number]     Arrival date & time 05/14/14  2212 History   First MD Initiated Contact with Patient 05/15/14 0029     Chief Complaint  Patient presents with  . Chest Pain     (Consider location/radiation/quality/duration/timing/severity/associated sxs/prior Treatment) HPI Patient presents to the emergency department with intermittent chest pain/pressure that has been ongoing since Wednesday.  The patient, states, that she's had variable, chest pain, over the last few days.  The patient, states, that he can last from minutes to 5 minutes and then will go away.  The patient, states, that she had a recent URI with cough.  Patient, states he still has some cough at this time.  Patient denies shortness of breath, weakness, lightheadedness, headache, blurred vision, back pain, neck pain, fever, dysuria, hematemesis, bloody stool, abdominal pain, rash, or syncope.  Patient, states she's not had any swelling or redness or warmth in her lower extremities.  The patient denies hemoptysis.  Patient, states nothing seems make her condition, better or worse.  This did not take any medications prior to arrival for her symptoms Past Medical History  Diagnosis Date  . Scoliosis    Past Surgical History  Procedure Laterality Date  . Scoli    . Back surgery     No family history on file. History  Substance Use Topics  . Smoking status: Former Smoker    Quit date: 12/07/2011  . Smokeless tobacco: Not on file  . Alcohol Use: No   OB History   Grav Para Term Preterm Abortions TAB SAB Ect Mult Living                 Review of Systems  All other systems negative except as documented in the HPI. All pertinent positives and negatives as reviewed in the HPI.  Allergies  Sulfamethoxazole-trimethoprim and Levofloxacin  Home Medications   Prior to Admission medications   Medication Sig Start Date End Date Taking? Authorizing Provider  cephALEXin (KEFLEX) 500 MG capsule Take 1 capsule (500 mg  total) by mouth 3 (three) times daily. 05/11/14  Yes Harden Mo, MD  metroNIDAZOLE (FLAGYL) 500 MG tablet Take 1 tablet (500 mg total) by mouth 2 (two) times daily. 05/11/14  Yes Harden Mo, MD   BP 118/78  Pulse 68  Temp(Src) 97.9 F (36.6 C)  Resp 20  Ht 5\' 6"  (1.676 m)  Wt 188 lb (85.276 kg)  BMI 30.36 kg/m2  SpO2 97%  LMP 05/04/2014 Physical Exam  Nursing note and vitals reviewed. Constitutional: She is oriented to person, place, and time. She appears well-developed and well-nourished. No distress.  HENT:  Head: Normocephalic and atraumatic.  Mouth/Throat: Oropharynx is clear and moist.  Eyes: Pupils are equal, round, and reactive to light.  Neck: Normal range of motion. Neck supple.  Cardiovascular: Normal rate, regular rhythm and normal heart sounds.  Exam reveals no gallop and no friction rub.   No murmur heard. Pulmonary/Chest: Effort normal and breath sounds normal. No respiratory distress. She has no wheezes. She has no rales. She exhibits no tenderness.  Abdominal: Soft. Bowel sounds are normal. She exhibits no distension. There is no tenderness.  Musculoskeletal: She exhibits no edema.  Neurological: She is alert and oriented to person, place, and time. She exhibits normal muscle tone. Coordination normal.  Skin: Skin is warm and dry. No rash noted. No erythema.    ED Course  Procedures (including critical care time) Labs Review Labs Reviewed  CBC - Abnormal; Notable  for the following:    HCT 35.9 (*)    All other components within normal limits  BASIC METABOLIC PANEL - Abnormal; Notable for the following:    GFR calc non Af Amer 84 (*)    All other components within normal limits  PRO B NATRIURETIC PEPTIDE  I-STAT TROPOININ, ED    Imaging Review Dg Chest 2 View  05/14/2014   CLINICAL DATA:  Chest pain.  Cough.  Initial encounter.  EXAM: CHEST  2 VIEW  COMPARISON:  None.  FINDINGS: The heart size and mediastinal contours are within normal limits when  accounting for distortion from scoliosis. Both lungs are clear. No effusion or pneumothorax. Thoracic dextroscoliosis status post posterior fixation. No gross hardware complication.  IMPRESSION: No active cardiopulmonary disease.   Electronically Signed   By: Jorje Guild M.D.   On: 05/14/2014 23:07     EKG Interpretation   Date/Time:  Sunday May 14 2014 22:19:35 EDT Ventricular Rate:  60 PR Interval:  162 QRS Duration: 102 QT Interval:  406 QTC Calculation: 406 R Axis:   24 Text Interpretation:  Normal sinus rhythm Incomplete right bundle branch  block Borderline ECG No prior for comparison Confirmed by HORTON  MD,  COURTNEY ([id number]) on 05/15/2014 12:27:46 AM     Patient has no signs or symptoms consistent with pulmonary embolus.  He is PERC negative and is low risk based on [place] criteria.  The patient is a former smoker and does not have any other risk factors for cardiac disease.  Patient has not had any diaphoresis, exertional shortness of breath, or chest pain.  Patient will be referred to a primary care Dr. and cardiology for followup.  Patient is advised of the results.  All questions were answered    Brent General, PA-C 05/15/14 [phone]

## 2014-05-15 NOTE — ED Notes (Signed)
Pt alert, NAD, calm, interactive, resps e/u, speaking in clear complete sentences, skin W&D, labs and CXR reviewed, EDPA at [facility].

## 2014-05-15 NOTE — ED Notes (Signed)
EDPA at [facility], pt updated. NAD, calm, interactive.

## 2014-05-15 NOTE — ED Notes (Signed)
GC/Chlamydia neg., Affirm: Candida and Trich neg., Gardnerella pos., Urine culture: >100,000 colonies E. Coli.  Pt. adequately treated with Flagyl and Keflex. Roselyn Meier 05/15/2014

## 2014-05-26 ENCOUNTER — Emergency Department (HOSPITAL_COMMUNITY): Payer: 59

## 2014-05-26 ENCOUNTER — Encounter (HOSPITAL_COMMUNITY): Payer: Self-pay | Admitting: Emergency Medicine

## 2014-05-26 DIAGNOSIS — Z8739 Personal history of other diseases of the musculoskeletal system and connective tissue: Secondary | ICD-10-CM | POA: Insufficient documentation

## 2014-05-26 DIAGNOSIS — R202 Paresthesia of skin: Secondary | ICD-10-CM | POA: Insufficient documentation

## 2014-05-26 DIAGNOSIS — R51 Headache: Secondary | ICD-10-CM | POA: Insufficient documentation

## 2014-05-26 DIAGNOSIS — H539 Unspecified visual disturbance: Secondary | ICD-10-CM | POA: Diagnosis not present

## 2014-05-26 DIAGNOSIS — Z87891 Personal history of nicotine dependence: Secondary | ICD-10-CM | POA: Insufficient documentation

## 2014-05-26 DIAGNOSIS — Z3202 Encounter for pregnancy test, result negative: Secondary | ICD-10-CM | POA: Insufficient documentation

## 2014-05-26 NOTE — ED Notes (Signed)
Pt reports headache to L side of head since yesterday. Today at noon she started experiencing L sided tingling/numbeness. Grips equal. Pt a&ox4. sts she also had some visual disturbance while driving.

## 2014-05-27 ENCOUNTER — Emergency Department (HOSPITAL_COMMUNITY)
Admission: EM | Admit: 2014-05-27 | Discharge: 2014-05-27 | Disposition: A | Discharge status: Home / Self Care | Payer: 59 | Attending: Emergency Medicine | Admitting: Emergency Medicine

## 2014-05-27 DIAGNOSIS — R202 Paresthesia of skin: Secondary | ICD-10-CM

## 2014-05-27 DIAGNOSIS — R519 Headache, unspecified: Secondary | ICD-10-CM

## 2014-05-27 DIAGNOSIS — R51 Headache: Secondary | ICD-10-CM

## 2014-05-27 LAB — CBC WITH DIFFERENTIAL/PLATELET
BASOS PCT: 0 % (ref 0–1)
Basophils Absolute: 0 10*3/uL (ref 0.0–0.1)
Eosinophils Absolute: 0.2 10*3/uL (ref 0.0–0.7)
Eosinophils Relative: 2 % (ref 0–5)
HEMATOCRIT: 36.2 % (ref 36.0–46.0)
HEMOGLOBIN: 12.3 g/dL (ref 12.0–15.0)
Lymphocytes Relative: 57 % — ABNORMAL HIGH (ref 12–46)
Lymphs Abs: 4.5 10*3/uL — ABNORMAL HIGH (ref 0.7–4.0)
MCH: 29.9 pg (ref 26.0–34.0)
MCHC: 34 g/dL (ref 30.0–36.0)
MCV: 87.9 fL (ref 78.0–100.0)
MONO ABS: 0.3 10*3/uL (ref 0.1–1.0)
MONOS PCT: 4 % (ref 3–12)
Neutro Abs: 3 10*3/uL (ref 1.7–7.7)
Neutrophils Relative %: 37 % — ABNORMAL LOW (ref 43–77)
Platelets: 288 10*3/uL (ref 150–400)
RBC: 4.12 MIL/uL (ref 3.87–5.11)
RDW: 12.9 % (ref 11.5–15.5)
WBC: 8 10*3/uL (ref 4.0–10.5)

## 2014-05-27 LAB — BASIC METABOLIC PANEL
ANION GAP: 11 (ref 5–15)
BUN: 10 mg/dL (ref 6–23)
CALCIUM: 9.7 mg/dL (ref 8.4–10.5)
CHLORIDE: 104 meq/L (ref 96–112)
CO2: 25 meq/L (ref 19–32)
CREATININE: 0.78 mg/dL (ref 0.50–1.10)
GFR calc non Af Amer: >90 mL/min (ref >90)
Glucose, Bld: 124 mg/dL — ABNORMAL HIGH (ref 70–99)
Potassium: 3.9 mEq/L (ref 3.7–5.3)
Sodium: 140 mEq/L (ref 137–147)

## 2014-05-27 MED ORDER — KETOROLAC TROMETHAMINE 30 MG/ML IJ SOLN
30.0000 mg | Freq: Once | INTRAMUSCULAR | Status: AC
  Administered 2014-05-27: 30 mg via INTRAVENOUS
  Filled 2014-05-27: qty 1

## 2014-05-27 MED ORDER — SODIUM CHLORIDE 0.9 % IV BOLUS (SEPSIS)
1000.0000 mL | Freq: Once | INTRAVENOUS | Status: AC
  Administered 2014-05-27: 1000 mL via INTRAVENOUS

## 2014-05-27 MED ORDER — METOCLOPRAMIDE HCL 5 MG/ML IJ SOLN
10.0000 mg | INTRAMUSCULAR | Status: AC
  Administered 2014-05-27: 10 mg via INTRAVENOUS
  Filled 2014-05-27: qty 2

## 2014-05-27 MED ORDER — DIPHENHYDRAMINE HCL 50 MG/ML IJ SOLN
25.0000 mg | Freq: Once | INTRAMUSCULAR | Status: AC
  Administered 2014-05-27: 25 mg via INTRAVENOUS
  Filled 2014-05-27: qty 1

## 2014-05-27 MED ORDER — TRAMADOL HCL 50 MG PO TABS: 50.0000 mg | ORAL_TABLET | Freq: Four times a day (QID) | ORAL | Status: DC | PRN

## 2014-05-27 NOTE — ED Provider Notes (Signed)
CSN: [id number]     Arrival date & time 05/26/14  2120 History   First MD Initiated Contact with Patient 05/27/14 0003     Chief Complaint  Patient presents with  . Headache  . Numbness     (Consider location/radiation/quality/duration/timing/severity/associated sxs/prior Treatment) HPI Comments: Patient is a 33 year old female with a past medical history of scoliosis who presents with headache that started yesterday. Since the onset, patient reports intermittent left sided headache without radiation. The pain is sharp and severe. She is unsure of the trigger. Today at noon, she experienced left face paresthesias as well as left arm paresthesias. The paresthesia lasted about 5 minutes and spontaneously resolved. She reports having a headache at this time. Later today, around 5pm patient reports the same symptoms as well as blurry vision in her left eye. She denies vision loss. The symptoms lasted for the same amount of time as previous episodes. Patient took 800mg  ibuprofen for pain which provided no relief. No aggravating/alleviating factors. No other associated symptoms. Patient has never experienced this previously.    Past Medical History  Diagnosis Date  . Scoliosis    Past Surgical History  Procedure Laterality Date  . Scoli    . Back surgery     No family history on file. History  Substance Use Topics  . Smoking status: Former Smoker    Quit date: 12/07/2011  . Smokeless tobacco: Not on file  . Alcohol Use: No   OB History   Grav Para Term Preterm Abortions TAB SAB Ect Mult Living                 Review of Systems  Constitutional: Negative for fever, chills and fatigue.  HENT: Negative for trouble swallowing.   Eyes: Positive for visual disturbance.  Respiratory: Negative for shortness of breath.   Cardiovascular: Negative for chest pain and palpitations.  Gastrointestinal: Negative for nausea, vomiting, abdominal pain and diarrhea.  Genitourinary: Negative for  dysuria and difficulty urinating.  Musculoskeletal: Negative for arthralgias and neck pain.  Skin: Negative for color change.  Neurological: Positive for numbness and headaches. Negative for dizziness and weakness.  Psychiatric/Behavioral: Negative for dysphoric mood.      Allergies  Sulfamethoxazole-trimethoprim and Levofloxacin  Home Medications   Prior to Admission medications   Not on File   BP 136/79  Pulse 73  Temp(Src) 98.1 F (36.7 C) (Oral)  Resp 16  Ht 5\' 6"  (1.676 m)  Wt 190 lb (86.183 kg)  BMI 30.68 kg/m2  SpO2 99%  LMP 05/15/2014 Physical Exam  Nursing note and vitals reviewed. Constitutional: She is oriented to person, place, and time. She appears well-developed and well-nourished. No distress.  HENT:  Head: Normocephalic and atraumatic.  Mouth/Throat: Oropharynx is clear and moist. No oropharyngeal exudate.  Eyes: Conjunctivae and EOM are normal. Pupils are equal, round, and reactive to light.  Neck: Normal range of motion.  Cardiovascular: Normal rate and regular rhythm.  Exam reveals no gallop and no friction rub.   No murmur heard. Pulmonary/Chest: Effort normal and breath sounds normal. She has no wheezes. She has no rales. She exhibits no tenderness.  Abdominal: Soft. There is no tenderness.  Musculoskeletal: Normal range of motion.  No midline spine tenderness to palpation.   Neurological: She is alert and oriented to person, place, and time. No cranial nerve deficit. Coordination normal.  Extremity strength and sensation equal and intact bilaterally. Speech is goal-oriented. Moves limbs without ataxia. No meningeal signs.   Skin:  Skin is warm and dry.  Psychiatric: She has a normal mood and affect. Her behavior is normal.    ED Course  Procedures (including critical care time) Labs Review Labs Reviewed  CBC WITH DIFFERENTIAL - Abnormal; Notable for the following:    Neutrophils Relative % 37 (*)    Lymphocytes Relative 57 (*)    Lymphs Abs  4.5 (*)    All other components within normal limits  BASIC METABOLIC PANEL - Abnormal; Notable for the following:    Glucose, Bld 124 (*)    All other components within normal limits  POC URINE PREG, ED    Imaging Review Ct Head (brain) Wo Contrast  05/26/2014   CLINICAL DATA:  Headache and numbness. Left-sided tingling and numbness. Some visual disturbances while driving.  EXAM: CT HEAD WITHOUT CONTRAST  TECHNIQUE: Contiguous axial images were obtained from the base of the skull through the vertex without intravenous contrast.  COMPARISON:  None.  FINDINGS: No acute intracranial abnormality identified. Specifically, no intra or extra-axial hemorrhage, mass effect, mass lesion, hydrocephalus, or evidence of acute infarction. The skull is intact. The visualized paranasal sinuses and mastoid air cells are clear. The soft tissues of the orbits and scalp are symmetric.  IMPRESSION: Normal head CT.   Electronically Signed   By: Curlene Dolphin M.D.   On: 05/26/2014 21:59     EKG Interpretation None      MDM   Final diagnoses:  Headache, unspecified headache type  Paresthesia of left arm    12:22 AM Labs pending. CT head unremarkable for acute change. Vitals stable and patient afebrile. Patient neurologically intact. Patient will have toradol, reglan, and benadryl at this time.   2:13 AM Labs unremarkable for acute changes. Patient reports improvement of her symptoms. Patient will have Tramadol and recommended neurology follow up. Vitals stable and patient afebrile. Patient instructed to return with worsening or concerning symptoms.    Alvina Chou, PA-C 05/27/14 0225

## 2014-05-27 NOTE — ED Provider Notes (Signed)
Medical screening examination/treatment/procedure(s) were conducted as a shared visit with non-physician practitioner(s) or resident  and myself.  I personally evaluated the patient during the encounter and agree with the findings.   I have personally reviewed any xrays and/ or EKG's with the provider and I agree with interpretation.   Healthy patient with intermittent headache and left-sided numbness and blurry vision. On my exam patient has minimal symptoms, normal neurologic exam in ER. Discussed differential diagnosis including complex migraine. CT head results unremarkable. 5+ strength in UE and LE with f/e at major joints. Sensation to palpation intact in UE and LE. CNs 2-12 grossly intact.  EOMFI.  PERRL.   Finger nose and coordination intact bilateral.   Visual fields intact to finger testing.  Discussed close outpatient followup with primary Dr. and neurology.  Headache  Mariea Clonts, MD 05/27/14 [phone]

## 2014-05-27 NOTE — Discharge Instructions (Signed)
Take Tramadol as needed for pain. Refer to attached documents for more information. Follow up with Dr. Armida Sans for further evaluation. Return to the ED with worsening or concerning symptoms.

## 2014-05-29 LAB — POC URINE PREG, ED: Preg Test, Ur: NEGATIVE

## 2014-10-26 ENCOUNTER — Encounter (HOSPITAL_COMMUNITY): Payer: Self-pay | Admitting: Emergency Medicine

## 2014-10-26 ENCOUNTER — Emergency Department (HOSPITAL_COMMUNITY)
Admission: EM | Admit: 2014-10-26 | Discharge: 2014-10-26 | Disposition: A | Discharge status: Home / Self Care | Payer: 59 | Attending: Emergency Medicine | Admitting: Emergency Medicine

## 2014-10-26 ENCOUNTER — Emergency Department (HOSPITAL_COMMUNITY): Payer: 59

## 2014-10-26 DIAGNOSIS — R079 Chest pain, unspecified: Secondary | ICD-10-CM | POA: Diagnosis present

## 2014-10-26 DIAGNOSIS — R002 Palpitations: Secondary | ICD-10-CM | POA: Insufficient documentation

## 2014-10-26 DIAGNOSIS — Z8739 Personal history of other diseases of the musculoskeletal system and connective tissue: Secondary | ICD-10-CM | POA: Insufficient documentation

## 2014-10-26 DIAGNOSIS — Z87891 Personal history of nicotine dependence: Secondary | ICD-10-CM | POA: Diagnosis not present

## 2014-10-26 LAB — CBC
HCT: 35.4 % — ABNORMAL LOW (ref 36.0–46.0)
HEMOGLOBIN: 11.9 g/dL — AB (ref 12.0–15.0)
MCH: 29.4 pg (ref 26.0–34.0)
MCHC: 33.6 g/dL (ref 30.0–36.0)
MCV: 87.4 fL (ref 78.0–100.0)
Platelets: 311 10*3/uL (ref 150–400)
RBC: 4.05 MIL/uL (ref 3.87–5.11)
RDW: 12.6 % (ref 11.5–15.5)
WBC: 7.3 10*3/uL (ref 4.0–10.5)

## 2014-10-26 LAB — BASIC METABOLIC PANEL
ANION GAP: 9 (ref 5–15)
BUN: 7 mg/dL (ref 6–23)
CO2: 27 mmol/L (ref 19–32)
CREATININE: 0.83 mg/dL (ref 0.50–1.10)
Calcium: 9.7 mg/dL (ref 8.4–10.5)
Chloride: 101 mmol/L (ref 96–112)
GFR calc Af Amer: >90 mL/min (ref >90)
GFR calc non Af Amer: >90 mL/min (ref >90)
Glucose, Bld: 107 mg/dL — ABNORMAL HIGH (ref 70–99)
Potassium: 3.9 mmol/L (ref 3.5–5.1)
Sodium: 137 mmol/L (ref 135–145)

## 2014-10-26 LAB — I-STAT TROPONIN, ED: Troponin i, poc: 0 ng/mL (ref 0.00–0.08)

## 2014-10-26 NOTE — ED Provider Notes (Signed)
CSN: [id number]     Arrival date & time 10/26/14  1855 History   First MD Initiated Contact with Patient 10/26/14 2155     Chief Complaint  Patient presents with  . Chest Pain     (Consider location/radiation/quality/duration/timing/severity/associated sxs/prior Treatment) HPI   34 year old female with past medical history scoliosis, who comes in with complaint of palpitations and chest heaviness since today. Patient says she was relaxing today when she started noticing that she was feeling skipped beats as well as feelings of chest heaviness. The skipped beats were associated with some mild lightheadedness but she never fainted and had no SOB. She says she has never had this happen before, no history of heart disease, no history of kidney disease. The chest heaviness has been ongoing since this morning and is intermittent and wandering, sometimes on the right-side of her chest, sometimes on the left side of the chest, no shortness of breath.   Past Medical History  Diagnosis Date  . Scoliosis    Past Surgical History  Procedure Laterality Date  . Scoli    . Back surgery     No family history on file. History  Substance Use Topics  . Smoking status: Former Smoker    Quit date: 12/07/2011  . Smokeless tobacco: Not on file  . Alcohol Use: No   OB History    No data available     Review of Systems  Constitutional: Negative for fever and chills.  HENT: Negative for nosebleeds.   Eyes: Negative for visual disturbance.  Respiratory: Negative for cough and shortness of breath.   Cardiovascular: Positive for palpitations. Negative for chest pain.  Gastrointestinal: Negative for nausea, vomiting, abdominal pain, diarrhea and constipation.  Genitourinary: Negative for dysuria.  Skin: Negative for rash.  Neurological: Negative for weakness.  All other systems reviewed and are negative.     Allergies  Sulfamethoxazole-trimethoprim and Levofloxacin  Home Medications   Prior  to Admission medications   Medication Sig Start Date End Date Taking? Authorizing Provider  traMADol (ULTRAM) 50 MG tablet Take 1 tablet (50 mg total) by mouth every 6 (six) hours as needed. Patient not taking: Reported on 10/26/2014 05/27/14   Kaitlyn Szekalski, PA-C   BP 123/76 mmHg  Pulse 72  Temp(Src) 98.1 F (36.7 C) (Oral)  Resp 17  Ht 5\' 6"  (1.676 m)  Wt 190 lb (86.183 kg)  BMI 30.68 kg/m2  SpO2 96% Physical Exam  Constitutional: She is oriented to person, place, and time. No distress.  HENT:  Head: Normocephalic and atraumatic.  Eyes: EOM are normal. Pupils are equal, round, and reactive to light.  Neck: Normal range of motion. Neck supple.  Cardiovascular: Normal rate and intact distal pulses.   Pulmonary/Chest: No respiratory distress.  Abdominal: Soft. There is no tenderness.  Musculoskeletal: Normal range of motion.  Neurological: She is alert and oriented to person, place, and time.  Skin: No rash noted. She is not diaphoretic.  Psychiatric: She has a normal mood and affect.    ED Course  Procedures (including critical care time) Labs Review Labs Reviewed  CBC - Abnormal; Notable for the following:    Hemoglobin 11.9 (*)    HCT 35.4 (*)    All other components within normal limits  BASIC METABOLIC PANEL - Abnormal; Notable for the following:    Glucose, Bld 107 (*)    All other components within normal limits  Randolm Idol, ED    Imaging Review Dg Chest 2 View  10/26/2014  CLINICAL DATA:  Chest pain.  EXAM: CHEST  2 VIEW  COMPARISON:  05/14/2014  FINDINGS: Heart size and pulmonary vascularity are normal and the lungs are clear. No effusions. Prominent thoracolumbar scoliosis. Harrington rods in place, unchanged.  IMPRESSION: No acute abnormalities.   Electronically Signed   By: Lorriane Shire M.D.   On: 10/26/2014 20:22     EKG Interpretation None      MDM   Final diagnoses:  None    34 year old female with past medical history scoliosis,  who comes in with complaint of palpitations and chest heaviness since today. Patient says she was relaxing today when she started noticing that she was feeling skipped beats as well as feelings of chest heaviness. The skipped beats were associated with some mild lightheadedness but she never fainted and had no SOB.  Exam as above, VSS.  ekg w/o evidence of wpw, brugada, long qt.  Basic labs obtained and WNL, trop neg and doubt ACS given her age and no risk factors.  Doubt PE given no prev dvt/pe, no SOB, normal vitals, not on birth control.    Unsure of the etiology of her palpitations, could be PVC's but feel that holter monitor is warranted to eval for possible more dangerous cause given the assciated sx of lightheadedness.  She has had no syncope, feel she is low enough risk for dangerous cardiac arrhythmia for outpatient work up.  I have discussed the results, Dx and Tx plan with the patient. They expressed understanding and agree with the plan and were told to return to ED with any worsening of condition or concern.    Disposition: Discharge  Condition: Good  Discharge Medication List as of 10/26/2014 10:53 PM      Follow Up: [place] [id number] [phone]  please follow up as soon as possible for a holter monitor   Pt seen in conjunction with Dr. Marijo Conception, MD 10/27/14 0147  Pattricia Boss, MD 10/28/14 [phone]

## 2014-10-26 NOTE — Discharge Instructions (Signed)

## 2014-10-26 NOTE — ED Notes (Signed)
Pt presents with chest palpations and pressure since last night, denies SOB or N/V.  NSR on EKG, no acute distress noted.

## 2014-11-29 ENCOUNTER — Ambulatory Visit: Payer: 59 | Admitting: Cardiovascular Disease

## 2015-06-13 IMAGING — CT CT HEAD W/O CM
1 series · 16 of 30 positions shown, 20 images · non-contrast
Comparison: None.

CLINICAL DATA: Headache and numbness. Left-sided tingling and
numbness. Some visual disturbances while driving.

EXAM:
CT HEAD WITHOUT CONTRAST
TECHNIQUE: Contiguous axial images were obtained from the base of the skull
through the vertex without intravenous contrast.

[Series 2: head 5.0 h30s · axial · 0.45mm/px · z∈[-136,-1]mm · 16 of 31 slices shown, 20 images]
[im 2/31  brain]
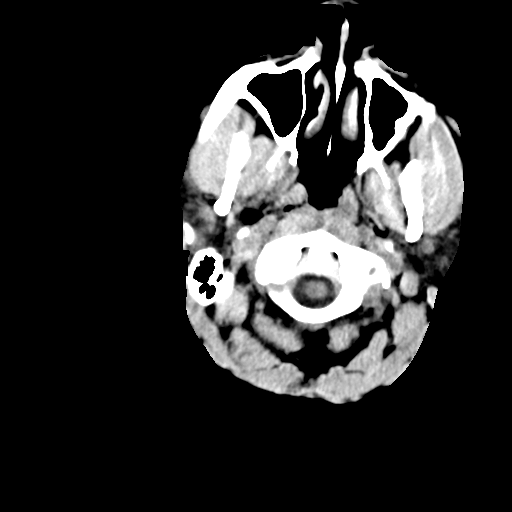
[im 2/31  bone]
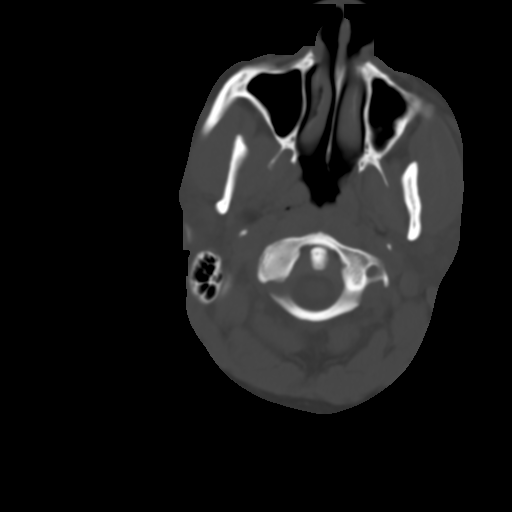
[im 4/31  brain]
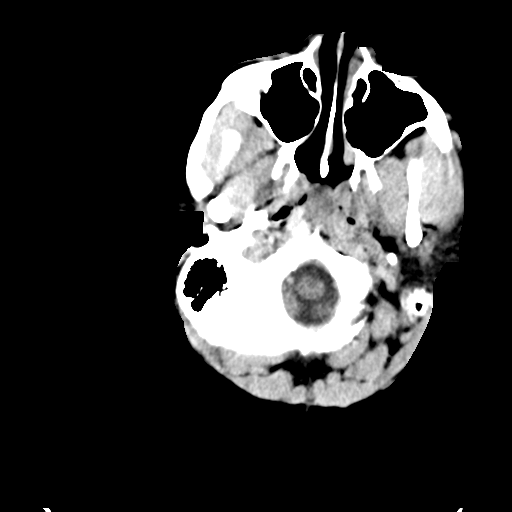
[im 6/31  brain]
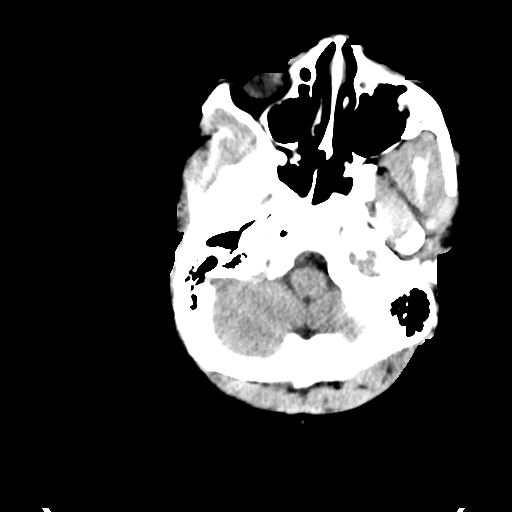
[im 8/31  brain]
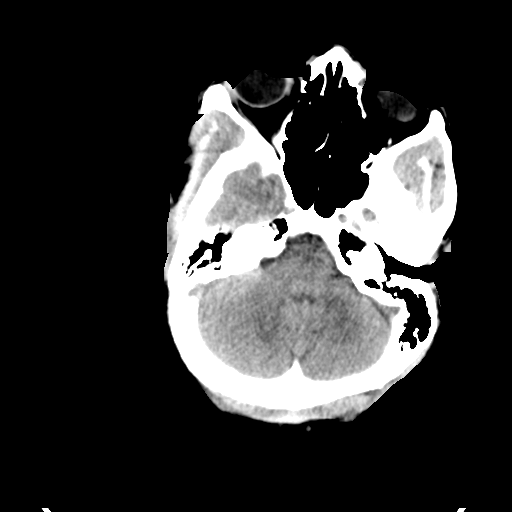
[im 9/31  brain]
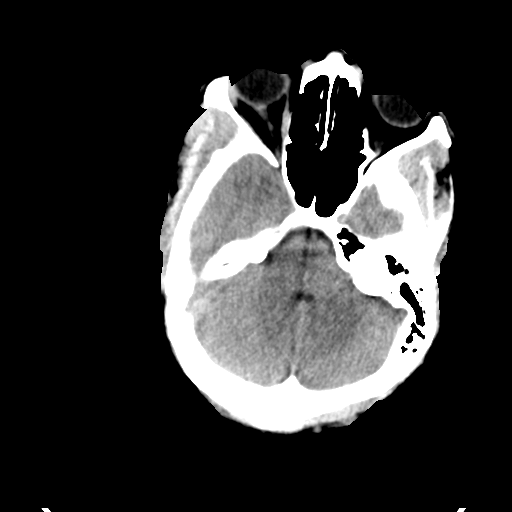
[im 9/31  bone]
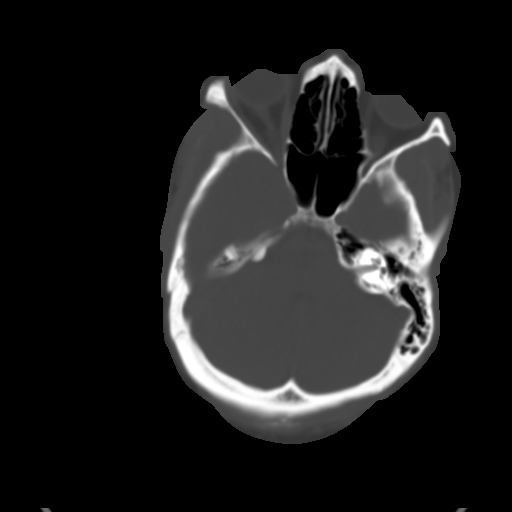
[im 11/31  brain]
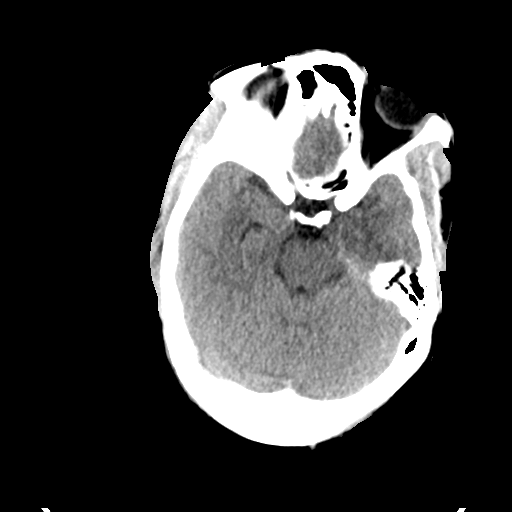
[im 13/31  brain]
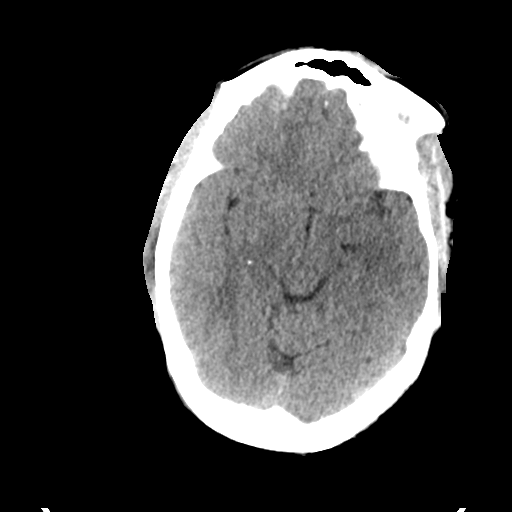
[im 15/31  brain]
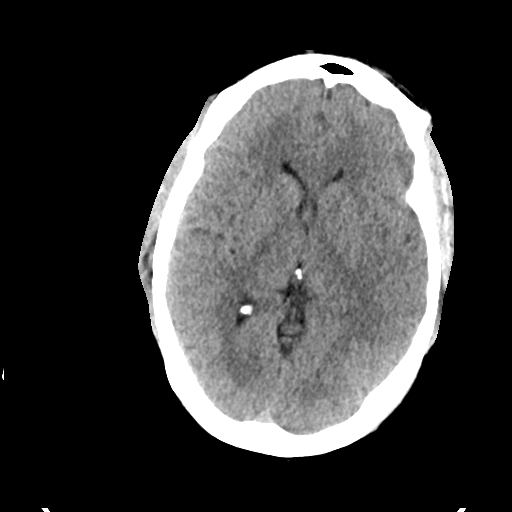
[im 16/31  brain]
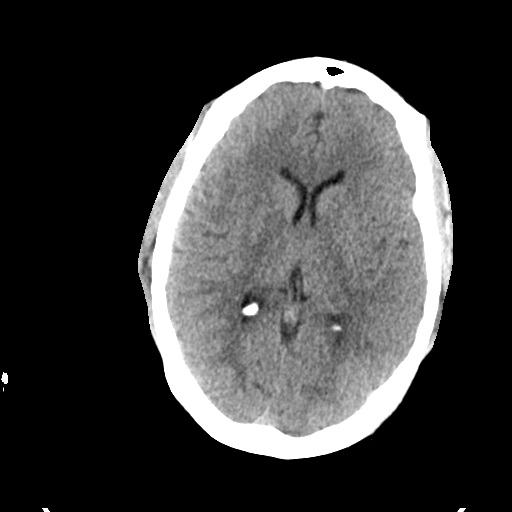
[im 16/31  bone]
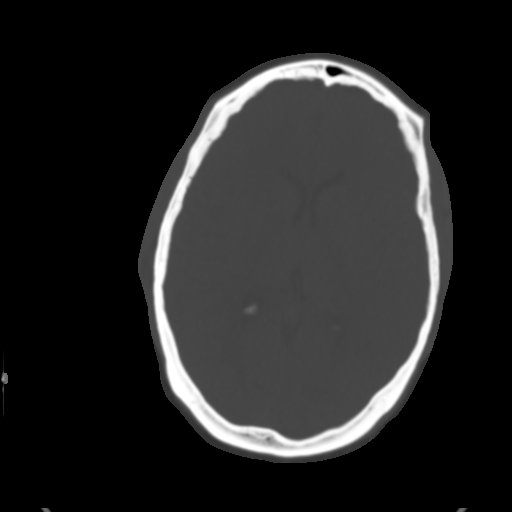
[im 18/31  brain]
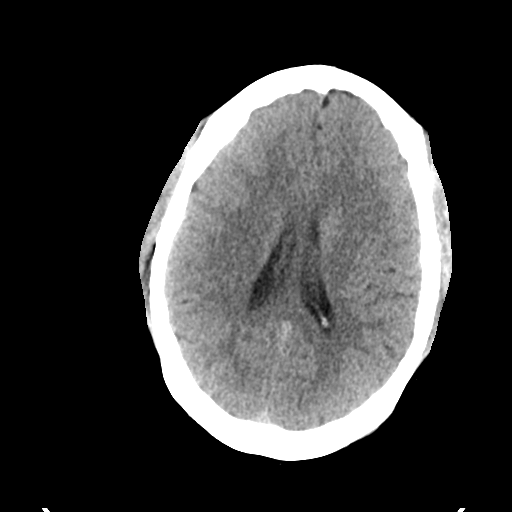
[im 20/31  brain]
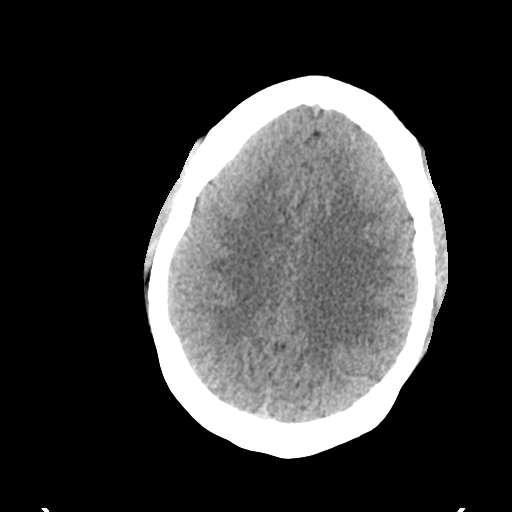
[im 22/31  brain]
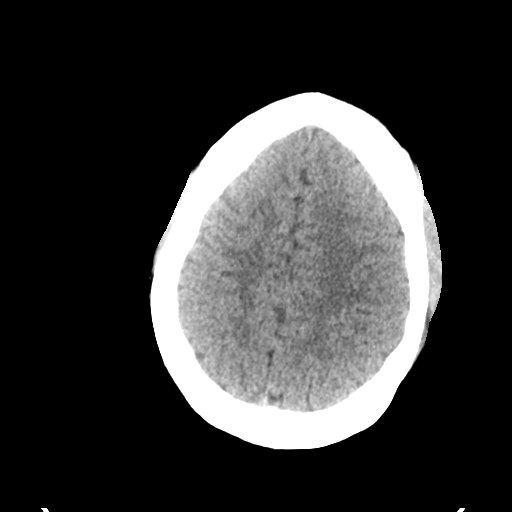
[im 23/31  brain]
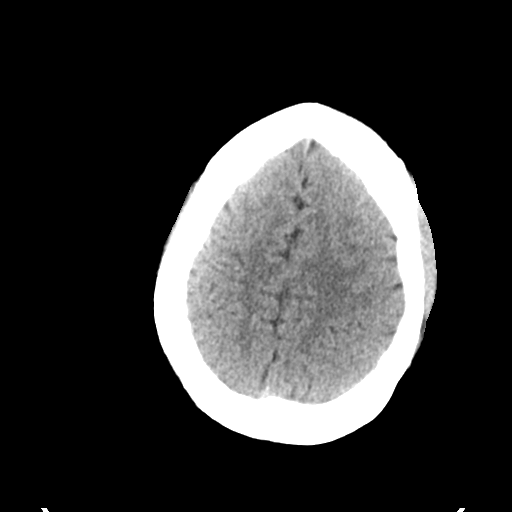
[im 23/31  bone]
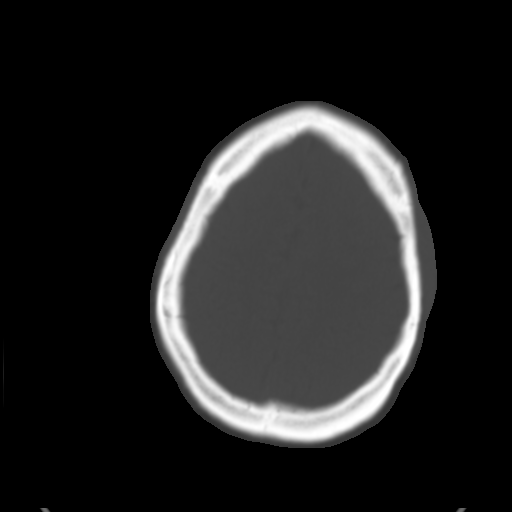
[im 25/31  brain]
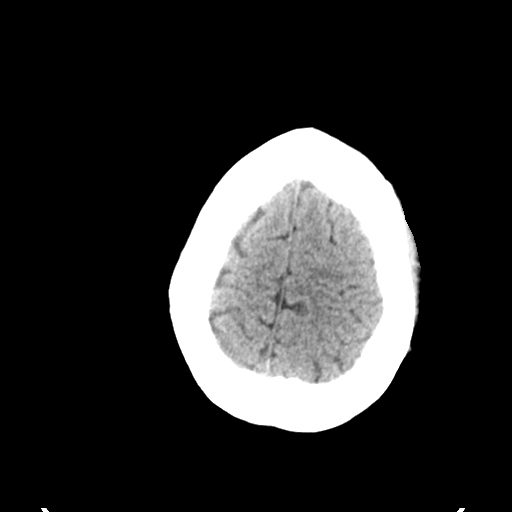
[im 27/31  brain]
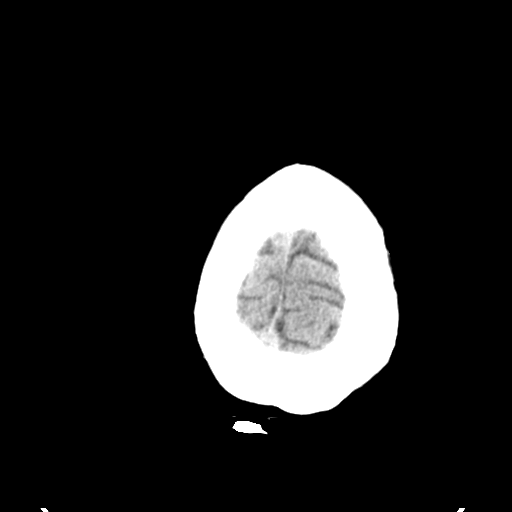
[im 29/31  brain]
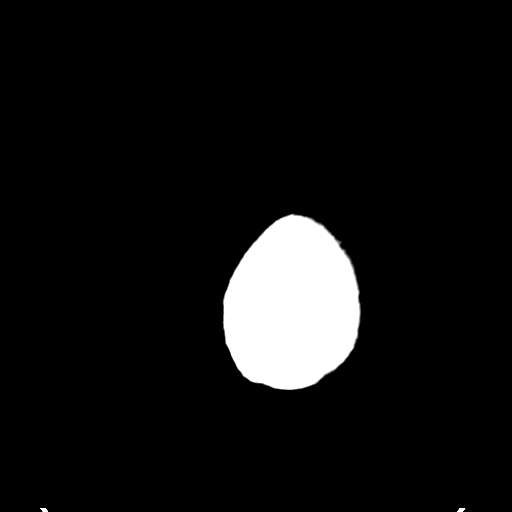

[16 of 30 positions shown; findings below may reference images not displayed]

FINDINGS: No acute intracranial abnormality identified. Specifically, no intra
or extra-axial hemorrhage, mass effect, mass lesion, hydrocephalus,
or evidence of acute infarction. The skull is intact. The visualized
paranasal sinuses and mastoid air cells are clear. The soft tissues
of the orbits and scalp are symmetric.
IMPRESSION: Normal head CT.

## 2015-10-19 DIAGNOSIS — J111 Influenza due to unidentified influenza virus with other respiratory manifestations: Secondary | ICD-10-CM | POA: Diagnosis not present

## 2015-10-19 DIAGNOSIS — R6889 Other general symptoms and signs: Secondary | ICD-10-CM | POA: Diagnosis not present

## 2016-01-19 DIAGNOSIS — Z7251 High risk heterosexual behavior: Secondary | ICD-10-CM | POA: Diagnosis not present

## 2016-01-19 DIAGNOSIS — N390 Urinary tract infection, site not specified: Secondary | ICD-10-CM | POA: Diagnosis not present

## 2016-01-19 DIAGNOSIS — R319 Hematuria, unspecified: Secondary | ICD-10-CM | POA: Diagnosis not present

## 2016-01-19 DIAGNOSIS — N912 Amenorrhea, unspecified: Secondary | ICD-10-CM | POA: Diagnosis not present

## 2016-04-18 DIAGNOSIS — N898 Other specified noninflammatory disorders of vagina: Secondary | ICD-10-CM | POA: Diagnosis not present

## 2016-04-18 DIAGNOSIS — Z0000 Encounter for general adult medical examination without abnormal findings: Secondary | ICD-10-CM | POA: Diagnosis not present

## 2016-04-18 DIAGNOSIS — R5383 Other fatigue: Secondary | ICD-10-CM | POA: Diagnosis not present

## 2016-04-18 DIAGNOSIS — Z0001 Encounter for general adult medical examination with abnormal findings: Secondary | ICD-10-CM | POA: Diagnosis not present

## 2016-04-18 DIAGNOSIS — Z114 Encounter for screening for human immunodeficiency virus [HIV]: Secondary | ICD-10-CM | POA: Diagnosis not present

## 2016-04-18 DIAGNOSIS — R0602 Shortness of breath: Secondary | ICD-10-CM | POA: Diagnosis not present

## 2016-04-18 DIAGNOSIS — N766 Ulceration of vulva: Secondary | ICD-10-CM | POA: Diagnosis not present

## 2016-04-18 DIAGNOSIS — F329 Major depressive disorder, single episode, unspecified: Secondary | ICD-10-CM | POA: Diagnosis not present

## 2016-04-18 DIAGNOSIS — E559 Vitamin D deficiency, unspecified: Secondary | ICD-10-CM | POA: Diagnosis not present

## 2016-04-18 LAB — PULMONARY FUNCTION TEST

## 2016-05-28 LAB — PULMONARY FUNCTION TEST

## 2016-09-11 ENCOUNTER — Encounter (HOSPITAL_COMMUNITY): Payer: Self-pay | Admitting: Emergency Medicine

## 2016-09-11 ENCOUNTER — Emergency Department (HOSPITAL_COMMUNITY)
Admission: EM | Admit: 2016-09-11 | Discharge: 2016-09-11 | Disposition: A | Discharge status: Home / Self Care | Payer: Medicaid Other | Attending: Emergency Medicine | Admitting: Emergency Medicine

## 2016-09-11 DIAGNOSIS — R51 Headache: Secondary | ICD-10-CM | POA: Insufficient documentation

## 2016-09-11 DIAGNOSIS — Z87891 Personal history of nicotine dependence: Secondary | ICD-10-CM | POA: Diagnosis not present

## 2016-09-11 DIAGNOSIS — R519 Headache, unspecified: Secondary | ICD-10-CM

## 2016-09-11 LAB — INFLUENZA PANEL BY PCR (TYPE A & B)
INFLBPCR: NEGATIVE
Influenza A By PCR: NEGATIVE

## 2016-09-11 MED ORDER — SODIUM CHLORIDE 0.9 % IV BOLUS (SEPSIS)
1000.0000 mL | Freq: Once | INTRAVENOUS | Status: AC
  Administered 2016-09-11: 1000 mL via INTRAVENOUS

## 2016-09-11 MED ORDER — PROCHLORPERAZINE EDISYLATE 5 MG/ML IJ SOLN
10.0000 mg | Freq: Once | INTRAMUSCULAR | Status: AC
  Administered 2016-09-11: 10 mg via INTRAVENOUS
  Filled 2016-09-11: qty 2

## 2016-09-11 MED ORDER — KETOROLAC TROMETHAMINE 30 MG/ML IJ SOLN
15.0000 mg | Freq: Once | INTRAMUSCULAR | Status: AC
  Administered 2016-09-11: 15 mg via INTRAVENOUS
  Filled 2016-09-11: qty 1

## 2016-09-11 MED ORDER — BUTALBITAL-APAP-CAFFEINE 50-325-40 MG PO TABS: 1.0000 | ORAL_TABLET | Freq: Four times a day (QID) | ORAL | 0 refills | Status: AC | PRN

## 2016-09-11 NOTE — ED Triage Notes (Signed)
Pt sts HA x 1 day

## 2016-09-11 NOTE — Discharge Instructions (Signed)
As discussed, your evaluation today has been largely reassuring.  But, it is important that you monitor your condition carefully, and do not hesitate to return to the ED if you develop new, or concerning changes in your condition. ? ?Otherwise, please follow-up with your physician for appropriate ongoing care. ? ?

## 2016-09-11 NOTE — ED Provider Notes (Signed)
[facility] DEPT Provider Note   CSN: [id number] Arrival date & time: 09/11/16  1000   By signing my name below, I, Caitlin Foster, attest that this documentation has been prepared under the direction and in the presence of Carmin Muskrat, MD . Electronically Signed: Evelene Foster, Scribe. 09/11/2016. 12:28 PM.  History   Chief Complaint Chief Complaint  Patient presents with  . Headache    The history is provided by the patient. No language interpreter was used.    HPI Comments:  Caitlin Foster is a 36 y.o. female who presents to the Emergency Department complaining of HA for the last day and a half with 10/10 pain. Pt has a h/o HA which usually occur around the start of her period but notes pain today is much worse. She has taken tylenol which has provided no relief. She reports associated neck and shoulder stiffness since last night, mild sore throat, and mild nausea. No recent change in diet, exercise, herbal supplements, or medications. She denies cough, vomiting, diarrhea, rash, SOB, and unilateral weakness. No flu shot received this season.   Past Medical History:  Diagnosis Date  . Scoliosis     Patient Active Problem List   Diagnosis Date Noted  . Hair loss 07/09/2012    Past Surgical History:  Procedure Laterality Date  . BACK SURGERY    . scoli      OB History    No data available       Home Medications    Prior to Admission medications   Medication Sig Start Date End Date Taking? Authorizing Provider  traMADol (ULTRAM) 50 MG tablet Take 1 tablet (50 mg total) by mouth every 6 (six) hours as needed. Patient not taking: Reported on 10/26/2014 05/27/14   Alvina Chou, PA-C    Family History History reviewed. No pertinent family history.  Social History Social History  Substance Use Topics  . Smoking status: Former Smoker    Quit date: 12/07/2011  . Smokeless tobacco: Not on file  . Alcohol use No     Allergies     Sulfamethoxazole-trimethoprim and Levofloxacin   Review of Systems Review of Systems  Constitutional:       Per HPI, otherwise negative  HENT:       Per HPI, otherwise negative  Respiratory:       Per HPI, otherwise negative  Cardiovascular:       Per HPI, otherwise negative  Gastrointestinal: Negative for vomiting.  Endocrine:       Negative aside from HPI  Genitourinary:       Neg aside from HPI   Musculoskeletal:       Per HPI, otherwise negative  Skin: Negative.   Neurological: Negative for syncope.     Physical Exam Updated Vital Signs BP 137/88 (BP Location: Left Arm)   Pulse 74   Temp 99.7 F (37.6 C) (Oral)   Resp 18   SpO2 97%   Physical Exam  Constitutional: She is oriented to person, place, and time. She appears well-developed and well-nourished. No distress.  HENT:  Head: Normocephalic and atraumatic.  Eyes: Conjunctivae and EOM are normal.  Cardiovascular: Normal rate and regular rhythm.   Pulmonary/Chest: Effort normal and breath sounds normal. No stridor. No respiratory distress.  Abdominal: She exhibits no distension.  Musculoskeletal: She exhibits no edema.  Neurological: She is alert and oriented to person, place, and time. No cranial nerve deficit.  Skin: Skin is warm and dry.  Psychiatric: She has a  normal mood and affect.  Nursing note and vitals reviewed.    ED Treatments / Results  DIAGNOSTIC STUDIES:  Oxygen Saturation is 97% on RA, normal by my interpretation.    COORDINATION OF CARE:  12:26 PM Discussed treatment plan with pt at bedside and pt agreed to plan.  Labs (all labs ordered are listed, but only abnormal results are displayed) Labs Reviewed  INFLUENZA PANEL BY PCR (TYPE A & B)      Procedures Procedures (including critical care time)  Medications Ordered in ED Medications  sodium chloride 0.9 % bolus 1,000 mL (not administered)  ketorolac (TORADOL) 30 MG/ML injection 15 mg (not administered)   prochlorperazine (COMPAZINE) injection 10 mg (not administered)     Initial Impression / Assessment and Plan / ED Course  I have reviewed the triage vital signs and the nursing notes.  Pertinent labs & imaging results that were available during my care of the patient were reviewed by me and considered in my medical decision making (see chart for details).  On repeat exam the patient is in no distress, has diminished pain. Influenza result negative, discussed with the patient. With reduction in pain, no evidence for neurologic deficiency, decompensation, and with patient's history of headache, there is low suspicion for acute new CNS process. Patient discharged in stable condition to follow-up with outpatient providers. 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   Final Clinical Impressions(s) / ED Diagnoses  Bad headache  Influenza-like illness  I personally performed the services described in this documentation, which was scribed in my presence. The recorded information has been reviewed and is accurate.      Carmin Muskrat, MD 09/11/16 1726

## 2016-09-13 ENCOUNTER — Emergency Department (HOSPITAL_COMMUNITY): Payer: Medicaid Other

## 2016-09-13 ENCOUNTER — Encounter (HOSPITAL_COMMUNITY): Payer: Self-pay | Admitting: Vascular Surgery

## 2016-09-13 ENCOUNTER — Emergency Department (HOSPITAL_COMMUNITY)
Admission: EM | Admit: 2016-09-13 | Discharge: 2016-09-14 | Disposition: A | Discharge status: Home / Self Care | Payer: Medicaid Other | Attending: Emergency Medicine | Admitting: Emergency Medicine

## 2016-09-13 DIAGNOSIS — R519 Headache, unspecified: Secondary | ICD-10-CM

## 2016-09-13 DIAGNOSIS — R51 Headache: Secondary | ICD-10-CM | POA: Diagnosis not present

## 2016-09-13 DIAGNOSIS — Z87891 Personal history of nicotine dependence: Secondary | ICD-10-CM | POA: Diagnosis not present

## 2016-09-13 LAB — BASIC METABOLIC PANEL
Anion gap: 9 (ref 5–15)
BUN: 5 mg/dL — AB (ref 6–20)
CO2: 28 mmol/L (ref 22–32)
CREATININE: 0.84 mg/dL (ref 0.44–1.00)
Calcium: 9.7 mg/dL (ref 8.9–10.3)
Chloride: 102 mmol/L (ref 101–111)
GFR calc Af Amer: >60 mL/min (ref >60)
GFR calc non Af Amer: >60 mL/min (ref >60)
Glucose, Bld: 124 mg/dL — ABNORMAL HIGH (ref 65–99)
Potassium: 3.4 mmol/L — ABNORMAL LOW (ref 3.5–5.1)
Sodium: 139 mmol/L (ref 135–145)

## 2016-09-13 LAB — CBC WITH DIFFERENTIAL/PLATELET
Basophils Absolute: 0 10*3/uL (ref 0.0–0.1)
Basophils Relative: 0 %
Eosinophils Absolute: 0.1 10*3/uL (ref 0.0–0.7)
Eosinophils Relative: 1 %
HCT: 34.4 % — ABNORMAL LOW (ref 36.0–46.0)
HEMOGLOBIN: 11.5 g/dL — AB (ref 12.0–15.0)
LYMPHS PCT: 53 %
Lymphs Abs: 4 10*3/uL (ref 0.7–4.0)
MCH: 29.8 pg (ref 26.0–34.0)
MCHC: 33.4 g/dL (ref 30.0–36.0)
MCV: 89.1 fL (ref 78.0–100.0)
MONOS PCT: 8 %
Monocytes Absolute: 0.6 10*3/uL (ref 0.1–1.0)
NEUTROS PCT: 38 %
Neutro Abs: 2.9 10*3/uL (ref 1.7–7.7)
Platelets: 278 10*3/uL (ref 150–400)
RBC: 3.86 MIL/uL — ABNORMAL LOW (ref 3.87–5.11)
RDW: 12.2 % (ref 11.5–15.5)
WBC: 7.6 10*3/uL (ref 4.0–10.5)

## 2016-09-13 MED ORDER — METOCLOPRAMIDE HCL 5 MG/ML IJ SOLN
10.0000 mg | Freq: Once | INTRAMUSCULAR | Status: AC
  Administered 2016-09-14: 10 mg via INTRAVENOUS
  Filled 2016-09-13: qty 2

## 2016-09-13 MED ORDER — DEXAMETHASONE SODIUM PHOSPHATE 10 MG/ML IJ SOLN
10.0000 mg | Freq: Once | INTRAMUSCULAR | Status: AC
  Administered 2016-09-14: 10 mg via INTRAVENOUS
  Filled 2016-09-13: qty 1

## 2016-09-13 MED ORDER — SODIUM CHLORIDE 0.9 % IV BOLUS (SEPSIS)
1000.0000 mL | Freq: Once | INTRAVENOUS | Status: AC
  Administered 2016-09-13: 1000 mL via INTRAVENOUS

## 2016-09-13 MED ORDER — KETOROLAC TROMETHAMINE 30 MG/ML IJ SOLN: 30.0000 mg | Freq: Once | INTRAMUSCULAR | Status: DC

## 2016-09-13 MED ORDER — DIPHENHYDRAMINE HCL 50 MG/ML IJ SOLN
25.0000 mg | Freq: Once | INTRAMUSCULAR | Status: AC
  Administered 2016-09-14: 25 mg via INTRAVENOUS
  Filled 2016-09-13: qty 1

## 2016-09-13 NOTE — ED Triage Notes (Signed)
Pt reports to the ED for eval of persistent HA and fevers. States she was seen on 2/8 for the same and had a flu test done which was negative. Reports some associated facial pressure/pain, nausea, decreased appetite, and constipation (last BM- 2/5), and chills. HA associated with some photophobia. Pt states that she was given Fioricet which helps her HA but then it comes back once the medication wears off. Denies any neck pain/stiffness.

## 2016-09-13 NOTE — ED Notes (Signed)
Nurse starting IV and will draw labs. 

## 2016-09-13 NOTE — ED Notes (Signed)
Patient transported to CT 

## 2016-09-14 LAB — URINALYSIS, ROUTINE W REFLEX MICROSCOPIC
BILIRUBIN URINE: NEGATIVE
GLUCOSE, UA: NEGATIVE mg/dL
Hgb urine dipstick: NEGATIVE
KETONES UR: NEGATIVE mg/dL
Leukocytes, UA: NEGATIVE
Nitrite: NEGATIVE
PH: 6 (ref 5.0–8.0)
Protein, ur: NEGATIVE mg/dL
Specific Gravity, Urine: 1.011 (ref 1.005–1.030)

## 2016-09-14 NOTE — ED Provider Notes (Signed)
[facility] DEPT Provider Note   CSN: [id number] Arrival date & time: 09/13/16  1851     History   Chief Complaint Chief Complaint  Patient presents with  . Headache    HPI Caitlin Foster is a 36 y.o. female.  Patient presents emergency department with chief complaint of headache. She states that she has had a persistent headache for the past 4-5 days. She states that she was seen here previously for headache. He states that she felt somewhat improved after headache cocktail, but when the symptoms persisted, she became worried, and came back to the emergency department. She reports mild photophobia. She states that she has had a fever, but is afebrile now. She denies any neck stiffness. Denies any numbness, weakness, or tingling. She reports that she feels better with OTC medications and Fioricet.   The history is provided by the patient. No language interpreter was used.    Past Medical History:  Diagnosis Date  . Scoliosis     Patient Active Problem List   Diagnosis Date Noted  . Hair loss 07/09/2012    Past Surgical History:  Procedure Laterality Date  . BACK SURGERY    . scoli      OB History    No data available       Home Medications    Prior to Admission medications   Medication Sig Start Date End Date Taking? Authorizing Provider  butalbital-acetaminophen-caffeine (FIORICET, ESGIC) 50-325-40 MG tablet Take 1-2 tablets by mouth every 6 (six) hours as needed for headache. 09/11/16 09/11/17 Yes Carmin Muskrat, MD  OVER THE COUNTER MEDICATION Apply 1 application topically 3 (three) times daily. "essential oils-multiple ones"   Yes Historical Provider, MD    Family History History reviewed. No pertinent family history.  Social History Social History  Substance Use Topics  . Smoking status: Former Smoker    Quit date: 12/07/2011  . Smokeless tobacco: Never Used  . Alcohol use No     Allergies   Levofloxacin and  Sulfamethoxazole-trimethoprim   Review of Systems Review of Systems  Neurological: Positive for headaches.  All other systems reviewed and are negative.    Physical Exam Updated Vital Signs BP 133/81   Pulse 64   Temp 97.8 F (36.6 C) (Oral)   Resp 16   SpO2 100%   Physical Exam  Constitutional: She is oriented to person, place, and time. She appears well-developed and well-nourished.  HENT:  Head: Normocephalic and atraumatic.  Right Ear: External ear normal.  Left Ear: External ear normal.  Eyes: Conjunctivae and EOM are normal. Pupils are equal, round, and reactive to light.  Neck: Normal range of motion. Neck supple.  No pain with neck flexion, no meningismus  Cardiovascular: Normal rate, regular rhythm and normal heart sounds.  Exam reveals no gallop and no friction rub.   No murmur heard. Pulmonary/Chest: Effort normal and breath sounds normal. No respiratory distress. She has no wheezes. She has no rales. She exhibits no tenderness.  Abdominal: Soft. She exhibits no distension and no mass. There is no tenderness. There is no rebound and no guarding.  Musculoskeletal: Normal range of motion. She exhibits no edema or tenderness.  Normal gait.  Neurological: She is alert and oriented to person, place, and time. She has normal reflexes.  CN 3-12 intact, normal finger to nose, no pronator drift, sensation and strength intact bilaterally.  Skin: Skin is warm and dry.  Psychiatric: She has a normal mood and affect. Her behavior is normal.  Judgment and thought content normal.  Nursing note and vitals reviewed.    ED Treatments / Results  Labs (all labs ordered are listed, but only abnormal results are displayed) Labs Reviewed  CBC WITH DIFFERENTIAL/PLATELET - Abnormal; Notable for the following:       Result Value   RBC 3.86 (*)    Hemoglobin 11.5 (*)    HCT 34.4 (*)    All other components within normal limits  BASIC METABOLIC PANEL - Abnormal; Notable for the  following:    Potassium 3.4 (*)    Glucose, Bld 124 (*)    BUN 5 (*)    All other components within normal limits  URINALYSIS, ROUTINE W REFLEX MICROSCOPIC    EKG  EKG Interpretation None       Radiology Ct Head Wo Contrast  Result Date: 09/13/2016 CLINICAL DATA:  Severe and persistent headache for 4 days. Pain is on the top of head radiating posteriorly. Sensitivity to bright lights. EXAM: CT HEAD WITHOUT CONTRAST TECHNIQUE: Contiguous axial images were obtained from the base of the skull through the vertex without intravenous contrast. COMPARISON:  05/26/2014 FINDINGS: Brain: No evidence of acute infarction, hemorrhage, hydrocephalus, extra-axial collection or mass lesion/mass effect. Vascular: No hyperdense vessel or unexpected calcification. Skull: Normal. Negative for fracture or focal lesion. Sinuses/Orbits: No acute finding. Other: No significant changes since previous study. IMPRESSION: No acute intracranial abnormalities. Electronically Signed   By: Lucienne Capers M.D.   On: 09/13/2016 22:55    Procedures Procedures (including critical care time)  Medications Ordered in ED Medications  sodium chloride 0.9 % bolus 1,000 mL (0 mLs Intravenous Stopped 09/14/16 0016)  metoCLOPramide (REGLAN) injection 10 mg (10 mg Intravenous Given 09/14/16 0000)  diphenhydrAMINE (BENADRYL) injection 25 mg (25 mg Intravenous Given 09/14/16 0000)  dexamethasone (DECADRON) injection 10 mg (10 mg Intravenous Given 09/14/16 0000)     Initial Impression / Assessment and Plan / ED Course  I have reviewed the triage vital signs and the nursing notes.  Pertinent labs & imaging results that were available during my care of the patient were reviewed by me and considered in my medical decision making (see chart for details).     Pt HA treated and improved while in ED. CT and labs are reassuring.  Presentation is like pts typical HA and non concerning for [facility], ICH, Meningitis, or temporal arteritis. Pt  is afebrile with no focal neuro deficits, nuchal rigidity, or change in vision. Pt is to follow up with PCP to discuss prophylactic medication. Pt verbalizes understanding and is agreeable with plan to dc.    Final Clinical Impressions(s) / ED Diagnoses   Final diagnoses:  Nonintractable headache, unspecified chronicity pattern, unspecified headache type    New Prescriptions New Prescriptions   No medications on file     Montine Circle, PA-C 09/14/16 0024    Leo Grosser, MD 09/14/16 Rogene [place]

## 2017-08-04 DIAGNOSIS — E559 Vitamin D deficiency, unspecified: Secondary | ICD-10-CM

## 2017-08-04 HISTORY — DX: Vitamin D deficiency, unspecified: E55.9

## 2017-08-20 DIAGNOSIS — Z5111 Encounter for antineoplastic chemotherapy: Secondary | ICD-10-CM | POA: Diagnosis not present

## 2017-08-20 DIAGNOSIS — N760 Acute vaginitis: Secondary | ICD-10-CM | POA: Diagnosis not present

## 2017-08-20 DIAGNOSIS — Z01419 Encounter for gynecological examination (general) (routine) without abnormal findings: Secondary | ICD-10-CM | POA: Diagnosis not present

## 2017-08-20 DIAGNOSIS — Z6831 Body mass index (BMI) 31.0-31.9, adult: Secondary | ICD-10-CM | POA: Diagnosis not present

## 2017-08-20 DIAGNOSIS — Z118 Encounter for screening for other infectious and parasitic diseases: Secondary | ICD-10-CM | POA: Diagnosis not present

## 2017-08-24 ENCOUNTER — Encounter: Payer: Self-pay | Admitting: Nurse Practitioner

## 2017-08-24 ENCOUNTER — Ambulatory Visit: Payer: BLUE CROSS/BLUE SHIELD | Admitting: Nurse Practitioner

## 2017-08-24 VITALS — BP 110/76 | HR 76 | Temp 98.0°F | Ht 66.0 in | Wt 197.0 lb

## 2017-08-24 DIAGNOSIS — F4323 Adjustment disorder with mixed anxiety and depressed mood: Secondary | ICD-10-CM

## 2017-08-24 DIAGNOSIS — Z0001 Encounter for general adult medical examination with abnormal findings: Secondary | ICD-10-CM | POA: Diagnosis not present

## 2017-08-24 DIAGNOSIS — Z136 Encounter for screening for cardiovascular disorders: Secondary | ICD-10-CM | POA: Diagnosis not present

## 2017-08-24 DIAGNOSIS — Z114 Encounter for screening for human immunodeficiency virus [HIV]: Secondary | ICD-10-CM | POA: Diagnosis not present

## 2017-08-24 DIAGNOSIS — K5900 Constipation, unspecified: Secondary | ICD-10-CM

## 2017-08-24 DIAGNOSIS — Z13220 Encounter for screening for lipoid disorders: Secondary | ICD-10-CM | POA: Diagnosis not present

## 2017-08-24 MED ORDER — FLUOXETINE HCL 20 MG PO TABS: 20.0000 mg | ORAL_TABLET | Freq: Every day | ORAL | 0 refills | Status: DC

## 2017-08-24 MED ORDER — POLYETHYLENE GLYCOL 3350 17 GM/SCOOP PO POWD: 17.0000 g | Freq: Every day | ORAL | 1 refills | Status: DC | PRN

## 2017-08-24 NOTE — Progress Notes (Signed)
Subjective:    Patient ID: Caitlin Foster, female    DOB: 1980/11/17, 37 y.o.   MRN: [id number]  Patient presents today for complete physical  Previous pcp with [facility] last seen 2017.  Depression       The patient presents with depression.  This is a recurrent problem.  The current episode started more than 1 year ago.   The onset quality is gradual.   The problem occurs intermittently.  The problem has been waxing and waning since onset.  Associated symptoms include decreased concentration, fatigue, insomnia, decreased interest, appetite change, myalgias and sad.  Associated symptoms include no helplessness, no hopelessness, not irritable, no restlessness, no body aches, no headaches, no indigestion and no suicidal ideas.     The symptoms are aggravated by work stress and family issues.  Past treatments include SSRIs - Selective serotonin reuptake inhibitors.  Compliance with treatment is poor.  Past compliance problems include medication issues.  Previous treatment provided no relief relief.  Past medical history includes anxiety and depression.     Pertinent negatives include no suicide attempts. Constipation  This is a chronic problem. The current episode started more than 1 year ago. The problem has been waxing and waning since onset. The stool is described as pellet like. The patient is not on a high fiber diet. She does not exercise regularly. There has not been adequate water intake. Associated symptoms include abdominal pain and bloating. Pertinent negatives include no anorexia, back pain, diarrhea, difficulty urinating, fecal incontinence, fever, flatus, hematochezia, hemorrhoids, melena, nausea, rectal pain, vomiting or weight loss. Risk factors include obesity. She has tried laxatives for the symptoms. The treatment provided moderate relief.  Lexapro used in past, taken for 4weeks, unable to tolerate (caused increased anxiety). Sleep:5hrs, non restorative, no daytime  napping, occasional snoring Job schedule 7am-7pm  FH on depression (mother and aunt).  Immunizations: (TDAP, Hep C screen, Pneumovax, Influenza, zoster)  Health Maintenance  Topic Date Due  . HIV Screening  01/19/1998  . Pap Smear  01/20/2004  . Flu Shot  03/04/2017  . Tetanus Vaccine  06/04/2024   Diet:regular.  Weight:  Wt Readings from Last 3 Encounters:  08/24/17 197 lb (89.4 kg)  10/26/14 190 lb (86.2 kg)  05/26/14 190 lb (86.2 kg)   Exercise:none.  Fall Risk: Fall Risk  08/24/2017 07/08/2012  Falls in the past year? No No   Home Safety:home with children.  Depression/Suicide: Depression screen [facility] 2/9 08/24/2017 07/08/2012  Decreased Interest 1 0  Down, Depressed, Hopeless 1 0  PHQ - 2 Score 2 0  Altered sleeping 2 -  Tired, decreased energy 2 -  Change in appetite 2 -  Feeling bad or failure about yourself  2 -  Trouble concentrating 2 -  Moving slowly or fidgety/restless 1 -  Suicidal thoughts 0 -  PHQ-9 Score 13 -   Pap Smear (every 40yrs for >21-29 without HPV, every 25yrs for >30-43yrs with HPV):up to date per patient.  Vision:will schedule.  Dental:will schedule.  Advanced Directive: Advanced Directives 09/13/2016  Does Patient Have a Medical Advance Directive? No  Would patient like information on creating a medical advance directive? -    Medications and allergies reviewed with patient and updated if appropriate.  Patient Active Problem List   Diagnosis Date Noted  . Hair loss 07/09/2012    Current Outpatient Medications on File Prior to Visit  Medication Sig Dispense Refill  . butalbital-acetaminophen-caffeine (FIORICET, ESGIC) 50-325-40 MG tablet Take  1-2 tablets by mouth every 6 (six) hours as needed for headache. (Patient not taking: Reported on 08/24/2017) 20 tablet 0  . OVER THE COUNTER MEDICATION Apply 1 application topically 3 (three) times daily. "essential oils-multiple ones"     No current facility-administered medications on file  prior to visit.     Past Medical History:  Diagnosis Date  . Scoliosis     Past Surgical History:  Procedure Laterality Date  . BACK SURGERY    . scoli      Social History   Socioeconomic History  . Marital status: Single    Spouse name: None  . Number of children: 2  . Years of education: None  . Highest education level: None  Social Needs  . Financial resource strain: None  . Food insecurity - worry: None  . Food insecurity - inability: None  . Transportation needs - medical: None  . Transportation needs - non-medical: None  Occupational History  . Occupation: LPN  Tobacco Use  . Smoking status: Current Every Day Smoker    Packs/day: 0.50    Types: E-cigarettes    Last attempt to quit: 12/07/2011    Years since quitting: 5.7  . Smokeless tobacco: Never Used  . Tobacco comment: spoke [employer]ports  Substance and Sexual Activity  . Alcohol use: Yes    Comment: social  . Drug use: No  . Sexual activity: Yes    Birth control/protection: None  Other Topics Concern  . None  Social History Narrative  . None    Family History  Problem Relation Age of Onset  . Diabetes Mother   . Heart attack Paternal Aunt   . Diabetes Maternal Grandmother        Review of Systems  Constitutional: Positive for appetite change and fatigue. Negative for fever, malaise/fatigue and weight loss.  HENT: Negative for congestion and sore throat.   Eyes:       Negative for visual changes  Respiratory: Negative for cough and shortness of breath.   Cardiovascular: Negative for chest pain, palpitations and leg swelling.  Gastrointestinal: Positive for abdominal pain and bloating. Negative for anorexia, blood in stool, constipation, diarrhea, flatus, heartburn, hematochezia, hemorrhoids, melena, nausea, rectal pain and vomiting.  Genitourinary: Negative for difficulty urinating, dysuria, frequency and urgency.  Musculoskeletal: Positive for myalgias. Negative for back pain, falls and joint  pain.  Skin: Negative for rash.  Neurological: Negative for dizziness, sensory change and headaches.  Endo/Heme/Allergies: Does not bruise/bleed easily.  Psychiatric/Behavioral: Positive for decreased concentration and depression. Negative for substance abuse and suicidal ideas. The patient has insomnia. The patient is not nervous/anxious.    Objective:   Vitals:   08/24/17 1435  BP: 110/76  Pulse: 76  Temp: 98 F (36.7 C)  SpO2: 99%    Body mass index is 31.8 kg/m.   Physical Examination:  Physical Exam  Constitutional: She is oriented to person, place, and time and well-developed, well-nourished, and in no distress. She is not irritable. No distress.  HENT:  Right Ear: External ear normal.  Left Ear: External ear normal.  Nose: Nose normal.  Mouth/Throat: Oropharynx is clear and moist. No oropharyngeal exudate.  Eyes: Conjunctivae and EOM are normal. Pupils are equal, round, and reactive to light. No scleral icterus.  Neck: Normal range of motion. Neck supple. No thyromegaly present.  Cardiovascular: Normal rate, normal heart sounds and intact distal pulses.  Pulmonary/Chest: Effort normal and breath sounds normal. She exhibits no tenderness.  Abdominal: Soft. Bowel sounds  are normal. She exhibits no distension. There is no tenderness.  Musculoskeletal: Normal range of motion. She exhibits no edema or tenderness.  Lymphadenopathy:    She has no cervical adenopathy.  Neurological: She is alert and oriented to person, place, and time. Gait normal.  Skin: Skin is warm and dry.  Psychiatric: Affect and judgment normal.    ASSESSMENT and PLAN:  Tomicka was seen today for establish care.  Diagnoses and all orders for this visit:  Encounter for preventative adult health care exam with abnormal findings -     CBC with Differential/Platelet; Future -     Comprehensive metabolic panel; Future -     TSH; Future -     Lipid panel; Future -     VITAMIN D 25 Hydroxy (Vit-D  Deficiency, Fractures); Future -     HIV antibody; Future  Adjustment disorder with mixed anxiety and depressed mood -     FLUoxetine (PROZAC) 20 MG tablet; Take 1 tablet (20 mg total) by mouth daily. -     Ambulatory referral to Psychology  Encounter for lipid screening for cardiovascular disease -     Lipid panel; Future  Encounter for screening for human immunodeficiency virus (HIV) -     HIV antibody; Future  Constipation, unspecified constipation type -     polyethylene glycol powder (GLYCOLAX/MIRALAX) powder; Take 17 g by mouth daily as needed.   No problem-specific Assessment & Plan notes found for this encounter.     Follow up: Return in about 4 weeks (around 09/21/2017) for depression.  Wilfred Lacy, NP

## 2017-08-24 NOTE — Patient Instructions (Signed)
You will be contacted to schedule appt with psychology.  I instructed pt to start 1/2 tablet once daily for 1 week and then increase to a full tablet once daily on week two as tolerated.   We discussed common side effects such as nausea, drowsiness and weight gain.  Also discussed rare but serious side effect of suicide ideation.  She is instructed to discontinue medication go directly to ED if this occurs.  Pt verbalizes understanding.   Plan follow up in 1 month to evaluate progress.  Return to lab within 1week for blood draw (fasting 6-8hrs prior to blood draw)/  Encourage increase activity (start with brisk walking 20-59mns a day)    Encourage adequate oral hydration with water  Increase fiber intake (fruits and vegetable).  Use warm water and salt to gaggle twice a day.  Health Maintenance, Female Adopting a healthy lifestyle and getting preventive care can go a long way to promote health and wellness. Talk with your health care provider about what schedule of regular examinations is right for you. This is a good chance for you to check in with your provider about disease prevention and staying healthy. In between checkups, there are plenty of things you can do on your own. Experts have done a lot of research about which lifestyle changes and preventive measures are most likely to keep you healthy. Ask your health care provider for more information. Weight and diet Eat a healthy diet  Be sure to include plenty of vegetables, fruits, low-fat dairy products, and lean protein.  Do not eat a lot of foods high in solid fats, added sugars, or salt.  Get regular exercise. This is one of the most important things you can do for your health. ? Most adults should exercise for at least 150 minutes each week. The exercise should increase your heart rate and make you sweat (moderate-intensity exercise). ? Most adults should also do strengthening exercises at least twice a week. This is in  addition to the moderate-intensity exercise.  Maintain a healthy weight  Body mass index (BMI) is a measurement that can be used to identify possible weight problems. It estimates body fat based on height and weight. Your health care provider can help determine your BMI and help you achieve or maintain a healthy weight.  For females 24years of age and older: ? A BMI below 18.5 is considered underweight. ? A BMI of 18.5 to 24.9 is normal. ? A BMI of 25 to 29.9 is considered overweight. ? A BMI of 30 and above is considered obese.  Watch levels of cholesterol and blood lipids  You should start having your blood tested for lipids and cholesterol at 35years of age, then have this test every 5 years.  You may need to have your cholesterol levels checked more often if: ? Your lipid or cholesterol levels are high. ? You are older than 35years of age. ? You are at high risk for heart disease.  Cancer screening Lung Cancer  Lung cancer screening is recommended for adults 539820years old who are at high risk for lung cancer because of a history of smoking.  A yearly low-dose CT scan of the lungs is recommended for people who: ? Currently smoke. ? Have quit within the past 15 years. ? Have at least a 30-pack-year history of smoking. A pack year is smoking an average of one pack of cigarettes a day for 1 year.  Yearly screening should continue until it  has been 15 years since you quit.  Yearly screening should stop if you develop a health problem that would prevent you from having lung cancer treatment.  Breast Cancer  Practice breast self-awareness. This means understanding how your breasts normally appear and feel.  It also means doing regular breast self-exams. Let your health care provider know about any changes, no matter how small.  If you are in your 20s or 30s, you should have a clinical breast exam (CBE) by a health care provider every 1-3 years as part of a regular health  exam.  If you are 68 or older, have a CBE every year. Also consider having a breast X-ray (mammogram) every year.  If you have a family history of breast cancer, talk to your health care provider about genetic screening.  If you are at high risk for breast cancer, talk to your health care provider about having an MRI and a mammogram every year.  Breast cancer gene (BRCA) assessment is recommended for women who have family members with BRCA-related cancers. BRCA-related cancers include: ? Breast. ? Ovarian. ? Tubal. ? Peritoneal cancers.  Results of the assessment will determine the need for genetic counseling and BRCA1 and BRCA2 testing.  Cervical Cancer Your health care provider may recommend that you be screened regularly for cancer of the pelvic organs (ovaries, uterus, and vagina). This screening involves a pelvic examination, including checking for microscopic changes to the surface of your cervix (Pap test). You may be encouraged to have this screening done every 3 years, beginning at age 55.  For women ages 87-65, health care providers may recommend pelvic exams and Pap testing every 3 years, or they may recommend the Pap and pelvic exam, combined with testing for human papilloma virus (HPV), every 5 years. Some types of HPV increase your risk of cervical cancer. Testing for HPV may also be done on women of any age with unclear Pap test results.  Other health care providers may not recommend any screening for nonpregnant women who are considered low risk for pelvic cancer and who do not have symptoms. Ask your health care provider if a screening pelvic exam is right for you.  If you have had past treatment for cervical cancer or a condition that could lead to cancer, you need Pap tests and screening for cancer for at least 20 years after your treatment. If Pap tests have been discontinued, your risk factors (such as having a new sexual partner) need to be reassessed to determine if  screening should resume. Some women have medical problems that increase the chance of getting cervical cancer. In these cases, your health care provider may recommend more frequent screening and Pap tests.  Colorectal Cancer  This type of cancer can be detected and often prevented.  Routine colorectal cancer screening usually begins at 35 years of age and continues through 35 years of age.  Your health care provider may recommend screening at an earlier age if you have risk factors for colon cancer.  Your health care provider may also recommend using home test kits to check for hidden blood in the stool.  A small camera at the end of a tube can be used to examine your colon directly (sigmoidoscopy or colonoscopy). This is done to check for the earliest forms of colorectal cancer.  Routine screening usually begins at age 39.  Direct examination of the colon should be repeated every 5-10 years through 35 years of age. However, you may need to be  screened more often if early forms of precancerous polyps or small growths are found.  Skin Cancer  Check your skin from head to toe regularly.  Tell your health care provider about any new moles or changes in moles, especially if there is a change in a mole's shape or color.  Also tell your health care provider if you have a mole that is larger than the size of a pencil eraser.  Always use sunscreen. Apply sunscreen liberally and repeatedly throughout the day.  Protect yourself by wearing long sleeves, pants, a wide-brimmed hat, and sunglasses whenever you are outside.  Heart disease, diabetes, and high blood pressure  High blood pressure causes heart disease and increases the risk of stroke. High blood pressure is more likely to develop in: ? People who have blood pressure in the high end of the normal range (130-139/85-89 mm Hg). ? People who are overweight or obese. ? People who are African American.  If you are 69-17 years of age, have  your blood pressure checked every 3-5 years. If you are 89 years of age or older, have your blood pressure checked every year. You should have your blood pressure measured twice-once when you are at a hospital or clinic, and once when you are not at a hospital or clinic. Record the average of the two measurements. To check your blood pressure when you are not at a hospital or clinic, you can use: ? An automated blood pressure machine at a pharmacy. ? A home blood pressure monitor.  If you are between 71 years and 87 years old, ask your health care provider if you should take aspirin to prevent strokes.  Have regular diabetes screenings. This involves taking a blood sample to check your fasting blood sugar level. ? If you are at a normal weight and have a low risk for diabetes, have this test once every three years after 35 years of age. ? If you are overweight and have a high risk for diabetes, consider being tested at a younger age or more often. Preventing infection Hepatitis B  If you have a higher risk for hepatitis B, you should be screened for this virus. You are considered at high risk for hepatitis B if: ? You were born in a country where hepatitis B is common. Ask your health care provider which countries are considered high risk. ? Your parents were born in a high-risk country, and you have not been immunized against hepatitis B (hepatitis B vaccine). ? You have HIV or AIDS. ? You use needles to inject street drugs. ? You live with someone who has hepatitis B. ? You have had sex with someone who has hepatitis B. ? You get hemodialysis treatment. ? You take certain medicines for conditions, including cancer, organ transplantation, and autoimmune conditions.  Hepatitis C  Blood testing is recommended for: ? Everyone born from [birth year] through 1963. ? Anyone with known risk factors for hepatitis C.  Sexually transmitted infections (STIs)  You should be screened for sexually  transmitted infections (STIs) including gonorrhea and chlamydia if: ? You are sexually active and are younger than 35 years of age. ? You are older than 35 years of age and your health care provider tells you that you are at risk for this type of infection. ? Your sexual activity has changed since you were last screened and you are at an increased risk for chlamydia or gonorrhea. Ask your health care provider if you are at risk.  If  you do not have HIV, but are at risk, it may be recommended that you take a prescription medicine daily to prevent HIV infection. This is called pre-exposure prophylaxis (PrEP). You are considered at risk if: ? You are sexually active and do not regularly use condoms or know the HIV status of your partner(s). ? You take drugs by injection. ? You are sexually active with a partner who has HIV.  Talk with your health care provider about whether you are at high risk of being infected with HIV. If you choose to begin PrEP, you should first be tested for HIV. You should then be tested every 3 months for as long as you are taking PrEP. Pregnancy  If you are premenopausal and you may become pregnant, ask your health care provider about preconception counseling.  If you may become pregnant, take 400 to 800 micrograms (mcg) of folic acid every day.  If you want to prevent pregnancy, talk to your health care provider about birth control (contraception). Osteoporosis and menopause  Osteoporosis is a disease in which the bones lose minerals and strength with aging. This can result in serious bone fractures. Your risk for osteoporosis can be identified using a bone density scan.  If you are 41 years of age or older, or if you are at risk for osteoporosis and fractures, ask your health care provider if you should be screened.  Ask your health care provider whether you should take a calcium or vitamin D supplement to lower your risk for osteoporosis.  Menopause may have  certain physical symptoms and risks.  Hormone replacement therapy may reduce some of these symptoms and risks. Talk to your health care provider about whether hormone replacement therapy is right for you. Follow these instructions at home:  Schedule regular health, dental, and eye exams.  Stay current with your immunizations.  Do not use any tobacco products including cigarettes, chewing tobacco, or electronic cigarettes.  If you are pregnant, do not drink alcohol.  If you are breastfeeding, limit how much and how often you drink alcohol.  Limit alcohol intake to no more than 1 drink per day for nonpregnant women. One drink equals 12 ounces of beer, 5 ounces of wine, or 1 ounces of hard liquor.  Do not use street drugs.  Do not share needles.  Ask your health care provider for help if you need support or information about quitting drugs.  Tell your health care provider if you often feel depressed.  Tell your health care provider if you have ever been abused or do not feel safe at home. This information is not intended to replace advice given to you by your health care provider. Make sure you discuss any questions you have with your health care provider. Document Released: 02/03/2011 Document Revised: 12/27/2015 Document Reviewed: 04/24/2015 Elsevier Interactive Patient Education  [employer].

## 2017-08-25 ENCOUNTER — Encounter: Payer: Self-pay | Admitting: Nurse Practitioner

## 2017-08-27 ENCOUNTER — Other Ambulatory Visit (INDEPENDENT_AMBULATORY_CARE_PROVIDER_SITE_OTHER): Payer: BLUE CROSS/BLUE SHIELD

## 2017-08-27 DIAGNOSIS — Z13220 Encounter for screening for lipoid disorders: Secondary | ICD-10-CM | POA: Diagnosis not present

## 2017-08-27 DIAGNOSIS — Z114 Encounter for screening for human immunodeficiency virus [HIV]: Secondary | ICD-10-CM | POA: Diagnosis not present

## 2017-08-27 DIAGNOSIS — Z136 Encounter for screening for cardiovascular disorders: Secondary | ICD-10-CM

## 2017-08-27 DIAGNOSIS — Z0001 Encounter for general adult medical examination with abnormal findings: Secondary | ICD-10-CM

## 2017-08-27 DIAGNOSIS — E559 Vitamin D deficiency, unspecified: Secondary | ICD-10-CM

## 2017-08-27 LAB — COMPREHENSIVE METABOLIC PANEL
ALT: 13 U/L (ref 0–35)
AST: 16 U/L (ref 0–37)
Albumin: 4.1 g/dL (ref 3.5–5.2)
Alkaline Phosphatase: 60 U/L (ref 39–117)
BUN: 10 mg/dL (ref 6–23)
CHLORIDE: 106 meq/L (ref 96–112)
CO2: 31 meq/L (ref 19–32)
Calcium: 9.4 mg/dL (ref 8.4–10.5)
Creatinine, Ser: 0.8 mg/dL (ref 0.40–1.20)
GFR: 105.22 mL/min (ref >60.00)
GLUCOSE: 106 mg/dL — AB (ref 70–99)
POTASSIUM: 4.2 meq/L (ref 3.5–5.1)
SODIUM: 144 meq/L (ref 135–145)
Total Bilirubin: 0.5 mg/dL (ref 0.2–1.2)
Total Protein: 7.1 g/dL (ref 6.0–8.3)

## 2017-08-27 LAB — LIPID PANEL
CHOLESTEROL: 197 mg/dL (ref 0–200)
HDL: 48.1 mg/dL (ref >39.00)
LDL CALC: 127 mg/dL — AB (ref 0–99)
NONHDL: 148.98
Total CHOL/HDL Ratio: 4
Triglycerides: 110 mg/dL (ref 0.0–149.0)
VLDL: 22 mg/dL (ref 0.0–40.0)

## 2017-08-27 LAB — CBC WITH DIFFERENTIAL/PLATELET
BASOS PCT: 0.4 % (ref 0.0–3.0)
Basophils Absolute: 0 10*3/uL (ref 0.0–0.1)
EOS PCT: 2.5 % (ref 0.0–5.0)
Eosinophils Absolute: 0.1 10*3/uL (ref 0.0–0.7)
HCT: 38.2 % (ref 36.0–46.0)
Hemoglobin: 12.6 g/dL (ref 12.0–15.0)
LYMPHS ABS: 2.9 10*3/uL (ref 0.7–4.0)
Lymphocytes Relative: 50.1 % — ABNORMAL HIGH (ref 12.0–46.0)
MCHC: 32.9 g/dL (ref 30.0–36.0)
MCV: 89.9 fl (ref 78.0–100.0)
MONO ABS: 0.3 10*3/uL (ref 0.1–1.0)
MONOS PCT: 5.8 % (ref 3.0–12.0)
NEUTROS PCT: 41.2 % — AB (ref 43.0–77.0)
Neutro Abs: 2.4 10*3/uL (ref 1.4–7.7)
Platelets: 317 10*3/uL (ref 150.0–400.0)
RBC: 4.25 Mil/uL (ref 3.87–5.11)
RDW: 13.1 % (ref 11.5–15.5)
WBC: 5.7 10*3/uL (ref 4.0–10.5)

## 2017-08-27 LAB — TSH: TSH: 2.65 u[IU]/mL (ref 0.35–4.50)

## 2017-08-27 LAB — VITAMIN D 25 HYDROXY (VIT D DEFICIENCY, FRACTURES): VITD: 9.42 ng/mL — ABNORMAL LOW (ref 30.00–100.00)

## 2017-08-28 LAB — HIV ANTIBODY (ROUTINE TESTING W REFLEX): HIV 1&2 Ab, 4th Generation: NONREACTIVE

## 2017-09-01 MED ORDER — VITAMIN D (ERGOCALCIFEROL) 1.25 MG (50000 UNIT) PO CAPS: 50000.0000 [IU] | ORAL_CAPSULE | ORAL | 0 refills | Status: DC

## 2017-09-12 ENCOUNTER — Encounter (HOSPITAL_COMMUNITY): Payer: Self-pay | Admitting: Emergency Medicine

## 2017-09-12 ENCOUNTER — Ambulatory Visit (HOSPITAL_COMMUNITY)
Admission: EM | Admit: 2017-09-12 | Discharge: 2017-09-12 | Disposition: A | Discharge status: Home / Self Care | Payer: BLUE CROSS/BLUE SHIELD | Attending: Family Medicine | Admitting: Family Medicine

## 2017-09-12 DIAGNOSIS — R6889 Other general symptoms and signs: Secondary | ICD-10-CM | POA: Diagnosis not present

## 2017-09-12 MED ORDER — OSELTAMIVIR PHOSPHATE 75 MG PO CAPS: 75.0000 mg | ORAL_CAPSULE | Freq: Two times a day (BID) | ORAL | 0 refills | Status: AC

## 2017-09-12 NOTE — Discharge Instructions (Signed)
Continue to push fluids, practice good hand hygiene, and cover your mouth if you cough.  If you start having increasing fevers, shaking or shortness of breath, seek immediate care.  Ibuprofen 400-600 mg (2-3 over the counter strength tabs) every 6 hours as needed for pain.  OK to take Tylenol 1000 mg (2 extra strength tabs) or 975 mg (3 regular strength tabs) every 6 hours as needed.

## 2017-09-12 NOTE — ED Provider Notes (Signed)
[name]    CSN: [id number] Arrival date & time: 09/12/17  1237     History   Chief Complaint Chief Complaint  Patient presents with  . URI    HPI Caitlin Foster is a 37 y.o. female.   HPI   Duration: 2 days  Associated symptoms: Fever, muscle aches, headache, runny nose, cough Denies: sinus pain, itchy watery eyes, ear pain, ear drainage, sore throat, wheezing and shortness of breath Treatment to date: Tylenol Sick contacts: Yes    Past Medical History:  Diagnosis Date  . Scoliosis     Patient Active Problem List   Diagnosis Date Noted  . Hair loss 07/09/2012    Past Surgical History:  Procedure Laterality Date  . BACK SURGERY     Harrington Rods secondary to scoliosis  . scoli      OB History    No data available       Home Medications    Prior to Admission medications   Medication Sig Start Date End Date Taking? Authorizing Provider  FLUoxetine (PROZAC) 20 MG tablet Take 1 tablet (20 mg total) by mouth daily. 08/24/17   Nche, Charlene Brooke, NP  oseltamivir (TAMIFLU) 75 MG capsule Take 1 capsule (75 mg total) by mouth 2 (two) times daily for 5 days. 09/12/17 09/17/17  Shelda Pal, DO  OVER THE COUNTER MEDICATION Apply 1 application topically 3 (three) times daily. "essential oils-multiple ones"    [provider]  polyethylene glycol powder (GLYCOLAX/MIRALAX) powder Take 17 g by mouth daily as needed. 08/24/17   Nche, Charlene Brooke, NP  Vitamin D, Ergocalciferol, (DRISDOL) 50000 units CAPS capsule Take 1 capsule (50,000 Units total) by mouth every 7 (seven) days. 09/01/17   Nche, Charlene Brooke, NP    Family History Family History  Problem Relation Age of Onset  . Diabetes Mother   . Depression Mother   . Heart attack Paternal Aunt   . Diabetes Maternal Grandmother   . Depression Maternal Aunt     Social History Social History   Tobacco Use  . Smoking status: Current Every Day Smoker    Packs/day: 0.50   Types: E-cigarettes    Last attempt to quit: 12/07/2011    Years since quitting: 5.7  . Smokeless tobacco: Never Used  . Tobacco comment: spoke [employer]  Substance Use Topics  . Alcohol use: Yes    Comment: social  . Drug use: No     Allergies   Levaquin  [levofloxacin in d5w]; Levofloxacin; Sulfa antibiotics; and Sulfamethoxazole-trimethoprim   Review of Systems Review of Systems  Constitutional: Positive for fever.  Respiratory: Positive for cough.      Physical Exam Triage Vital Signs ED Triage Vitals  Enc Vitals Group     BP 09/12/17 1423 126/81     Pulse Rate 09/12/17 1423 80     Resp 09/12/17 1423 16     Temp 09/12/17 1423 98.5 F (36.9 C)     Temp Source 09/12/17 1423 Oral     SpO2 09/12/17 1423 95 %   Updated Vital Signs BP 126/81 (BP Location: Left Arm)   Pulse 80   Temp 98.5 F (36.9 C) (Oral)   Resp 16   LMP 08/15/2017   SpO2 95%   Physical Exam  Constitutional: She appears well-developed.  HENT:  Head: Normocephalic.  Right Ear: External ear normal.  Left Ear: External ear normal.  Nose: Nose normal.  Mouth/Throat: Oropharynx is clear and moist. No oropharyngeal exudate.  Eyes: EOM are normal. Pupils are equal, round, and reactive to light.  Neck: Normal range of motion.  Cardiovascular: Normal rate and regular rhythm.  No murmur heard. Pulmonary/Chest: Effort normal and breath sounds normal. No respiratory distress.  Lymphadenopathy:    She has no cervical adenopathy.  Skin: She is not diaphoretic.  Psychiatric: She has a normal mood and affect. Judgment normal.     UC Treatments / Results  Procedures Procedures none  Initial Impression / Assessment and Plan / UC Course  I have reviewed the triage vital signs and the nursing notes.  Pertinent labs & imaging results that were available during my care of the patient were reviewed by me and considered in my medical decision making (see chart for details).     Pt presents with signs  and symptoms concerning for flu.  Will treat with Tamiflu.  Supportive care also recommended.  Follow-up with PCP if symptoms worsen or fail to improve.  The patient voiced understanding and agreement to the plan.  Final Clinical Impressions(s) / UC Diagnoses   Final diagnoses:  Flu-like symptoms    ED Discharge Orders        Ordered    oseltamivir (TAMIFLU) 75 MG capsule  2 times daily     09/12/17 1440       Controlled Substance Prescriptions [place] Controlled Substance Registry consulted? Not Applicable   Shelda Pal, [place] 09/12/17 2235

## 2017-09-12 NOTE — ED Triage Notes (Signed)
PT C/O: cold sx onset 2 days associated w/HA, fever, body aches  TAKING MEDS: acetaminophen   A&O x4... NAD... Ambulatory

## 2017-09-21 ENCOUNTER — Ambulatory Visit: Payer: BLUE CROSS/BLUE SHIELD | Admitting: Psychology

## 2017-09-21 DIAGNOSIS — F411 Generalized anxiety disorder: Secondary | ICD-10-CM | POA: Diagnosis not present

## 2017-09-21 DIAGNOSIS — F331 Major depressive disorder, recurrent, moderate: Secondary | ICD-10-CM

## 2017-09-24 ENCOUNTER — Encounter: Payer: Self-pay | Admitting: Nurse Practitioner

## 2017-09-24 ENCOUNTER — Ambulatory Visit: Payer: BLUE CROSS/BLUE SHIELD | Admitting: Nurse Practitioner

## 2017-09-24 VITALS — BP 122/80 | HR 76 | Temp 98.0°F | Ht 66.0 in | Wt 198.0 lb

## 2017-09-24 DIAGNOSIS — G4700 Insomnia, unspecified: Secondary | ICD-10-CM | POA: Diagnosis not present

## 2017-09-24 DIAGNOSIS — F339 Major depressive disorder, recurrent, unspecified: Secondary | ICD-10-CM | POA: Insufficient documentation

## 2017-09-24 DIAGNOSIS — R51 Headache: Secondary | ICD-10-CM

## 2017-09-24 DIAGNOSIS — G8929 Other chronic pain: Secondary | ICD-10-CM | POA: Insufficient documentation

## 2017-09-24 DIAGNOSIS — J0100 Acute maxillary sinusitis, unspecified: Secondary | ICD-10-CM | POA: Diagnosis not present

## 2017-09-24 DIAGNOSIS — F4323 Adjustment disorder with mixed anxiety and depressed mood: Secondary | ICD-10-CM | POA: Diagnosis not present

## 2017-09-24 DIAGNOSIS — R519 Headache, unspecified: Secondary | ICD-10-CM | POA: Insufficient documentation

## 2017-09-24 DIAGNOSIS — M549 Dorsalgia, unspecified: Secondary | ICD-10-CM

## 2017-09-24 DIAGNOSIS — F32A Depression, unspecified: Secondary | ICD-10-CM | POA: Insufficient documentation

## 2017-09-24 DIAGNOSIS — E559 Vitamin D deficiency, unspecified: Secondary | ICD-10-CM | POA: Insufficient documentation

## 2017-09-24 MED ORDER — FLUTICASONE PROPIONATE 50 MCG/ACT NA SUSP: 2.0000 | Freq: Every day | NASAL | 0 refills | Status: DC

## 2017-09-24 MED ORDER — GUAIFENESIN ER 600 MG PO TB12: 600.0000 mg | ORAL_TABLET | Freq: Two times a day (BID) | ORAL | 0 refills | Status: DC | PRN

## 2017-09-24 MED ORDER — OXYMETAZOLINE HCL 0.05 % NA SOLN: 1.0000 | Freq: Two times a day (BID) | NASAL | 0 refills | Status: DC

## 2017-09-24 MED ORDER — FLUOXETINE HCL 20 MG PO TABS: 20.0000 mg | ORAL_TABLET | Freq: Every day | ORAL | 0 refills | Status: DC

## 2017-09-24 MED ORDER — TEMAZEPAM 7.5 MG PO CAPS: 7.5000 mg | ORAL_CAPSULE | Freq: Every evening | ORAL | 1 refills | Status: DC | PRN

## 2017-09-24 MED ORDER — AMOXICILLIN-POT CLAVULANATE 875-125 MG PO TABS: 1.0000 | ORAL_TABLET | Freq: Two times a day (BID) | ORAL | 0 refills | Status: DC

## 2017-09-24 NOTE — Patient Instructions (Addendum)
Continue appts with psychology.  URI Instructions: Flonase and Afrin use: apply 1spray of afrin in each nare, wait 4mins, then apply 2sprays of flonase in each nare. Use both nasal spray consecutively x 3days, then flonase only for at least 14days.  Encourage adequate oral hydration.  Use over-the-counter  "cold" medicines  such as "Tylenol cold" , "Advil cold",  "Mucinex" or" Mucinex D"  for cough and congestion.  Avoid decongestants if you have high blood pressure. Use" Delsym" or" Robitussin" cough syrup varietis for cough.  You can use plain "Tylenol" or "Advi"l for fever, chills and achyness.  Major Depressive Disorder, Adult Major depressive disorder (MDD) is a mental health condition. MDD often makes you feel sad, hopeless, or helpless. MDD can also cause symptoms in your body. MDD can affect your:  Work.  School.  Relationships.  Other normal activities.  MDD can range from mild to very bad. It may occur once (single episode MDD). It can also occur many times (recurrent MDD). The main symptoms of MDD often include:  Feeling sad, depressed, or irritable most of the time.  Loss of interest.  MDD symptoms also include:  Sleeping too much or too little.  Eating too much or too little.  A change in your weight.  Feeling tired (fatigue) or having low energy.  Feeling worthless.  Feeling guilty.  Trouble making decisions.  Trouble thinking clearly.  Thoughts of suicide or harming others.  Feeling weak.  Feeling agitated.  Keeping yourself from being around other people (isolation).  Follow these instructions at home: Activity  Do these things as told by your doctor: ? Go back to your normal activities. ? Exercise regularly. ? Spend time outdoors. Alcohol  Talk with your doctor about how alcohol can affect your antidepressant medicines.  Do not drink alcohol. Or, limit how much alcohol you drink. ? This means no more than 1 drink a day for  nonpregnant women and 2 drinks a day for men. One drink equals one of these:  12 oz of beer.  5 oz of wine.  1 oz of hard liquor. General instructions  Take over-the-counter and prescription medicines only as told by your doctor.  Eat a healthy diet.  Get plenty of sleep.  Find activities that you enjoy. Make time to do them.  Think about joining a support group. Your doctor may be able to suggest a group for you.  Keep all follow-up visits as told by your doctor. This is important. Where to find more information:  [employer] on Mental Illness: ? www.nami.[place]: ? https://carter.com/  National Suicide Prevention Lifeline: ? 585-780-7673. This is free, 24-hour help. Contact a doctor if:  Your symptoms get worse.  You have new symptoms. Get help right away if:  You self-harm.  You see, hear, taste, smell, or feel things that are not present (hallucinate). If you ever feel like you may hurt yourself or others, or have thoughts about taking your own life, get help right away. You can go to your nearest emergency department or call:  Your local emergency services (911 in the [place]  A suicide crisis helpline, such as the National Suicide Prevention Lifeline: ? 564-624-1736. This is open 24 hours a day.  This information is not intended to replace advice given to you by your health care provider. Make sure you discuss any questions you have with your health care provider. Document Released: 07/02/2015 Document Revised: 04/06/2016 Document Reviewed: 04/06/2016 Elsevier Interactive  Patient Education  2017 Else[employer] Inc[employer]  

## 2017-09-24 NOTE — Progress Notes (Signed)
Subjective:  Patient ID: Caitlin Foster, female    DOB: [birth year]  Age: 37 y.o. MRN: [id number]  CC: Follow-up (1 mo f/u/depression. felt like no changed) and Nasal Congestion (congestion,runny nose,pressure in head---3 days. )  Sinusitis  This is a new problem. The current episode started 1 to 4 weeks ago. The problem has been waxing and waning since onset. There has been no fever. Associated symptoms include congestion, coughing, ear pain, headaches, a hoarse voice, sinus pressure and a sore throat. Pertinent negatives include no chills, diaphoresis, neck pain, shortness of breath, sneezing or swollen glands. Past treatments include oral decongestants and acetaminophen. The treatment provided no relief.   Depression and Anxiety: No change with prozac. Denies any adverse side effects. worse symptoms: insomnia (difficulty falling asleep and staying asleep). Depression screen [facility] 2/9 09/24/2017 08/24/2017 07/08/2012  Decreased Interest 2 1 0  Down, Depressed, Hopeless 2 1 0  PHQ - 2 Score 4 2 0  Altered sleeping 2 2 -  Tired, decreased energy 1 2 -  Change in appetite 2 2 -  Feeling bad or failure about yourself  1 2 -  Trouble concentrating 2 2 -  Moving slowly or fidgety/restless 1 1 -  Suicidal thoughts 0 0 -  PHQ-9 Score 13 13 -   GAD 7 : Generalized Anxiety Score 09/24/2017 08/24/2017  Nervous, Anxious, on Edge 3 2  Control/stop worrying 2 3  Worry too much - different things 2 3  Trouble relaxing 2 3  Restless 2 2  Easily annoyed or irritable 2 2  Afraid - awful might happen 2 3  Total GAD 7 Score 15 18  Anxiety Difficulty - Somewhat difficult    Outpatient Medications Prior to Visit  Medication Sig Dispense Refill  . OVER THE COUNTER MEDICATION Apply 1 application topically 3 (three) times daily. "essential oils-multiple ones"    . polyethylene glycol powder (GLYCOLAX/MIRALAX) powder Take 17 g by mouth daily as needed. 850 g 1  . Vitamin D, Ergocalciferol, (DRISDOL)  50000 units CAPS capsule Take 1 capsule (50,000 Units total) by mouth every 7 (seven) days. 12 capsule 0  . FLUoxetine (PROZAC) 20 MG tablet Take 1 tablet (20 mg total) by mouth daily. 30 tablet 0   No facility-administered medications prior to visit.     ROS See HPI  Objective:  BP 122/80   Pulse 76   Temp 98 F (36.7 C)   Ht 5\' 6"  (1.676 m)   Wt 198 lb (89.8 kg)   SpO2 99%   BMI 31.96 kg/m   BP Readings from Last 3 Encounters:  09/24/17 122/80  09/12/17 126/81  08/24/17 110/76    Wt Readings from Last 3 Encounters:  09/24/17 198 lb (89.8 kg)  08/24/17 197 lb (89.4 kg)  10/26/14 190 lb (86.2 kg)    Physical Exam  Constitutional: She is oriented to person, place, and time.  HENT:  Right Ear: Tympanic membrane, external ear and ear canal normal.  Left Ear: Tympanic membrane, external ear and ear canal normal.  Nose: Mucosal edema and rhinorrhea present. Right sinus exhibits maxillary sinus tenderness. Right sinus exhibits no frontal sinus tenderness. Left sinus exhibits maxillary sinus tenderness. Left sinus exhibits no frontal sinus tenderness.  Mouth/Throat: Uvula is midline. No trismus in the jaw. Posterior oropharyngeal erythema present. No oropharyngeal exudate.  Eyes: No scleral icterus.  Neck: Normal range of motion. Neck supple.  Cardiovascular: Normal rate and normal heart sounds.  Pulmonary/Chest: Effort normal and breath sounds  normal.  Musculoskeletal: She exhibits no edema.  Lymphadenopathy:    She has cervical adenopathy.  Neurological: She is alert and oriented to person, place, and time.  Vitals reviewed.   Lab Results  Component Value Date   WBC 5.7 08/27/2017   HGB 12.6 08/27/2017   HCT 38.2 08/27/2017   PLT 317.0 08/27/2017   GLUCOSE 106 (H) 08/27/2017   CHOL 197 08/27/2017   TRIG 110.0 08/27/2017   HDL 48.10 08/27/2017   LDLCALC 127 (H) 08/27/2017   ALT 13 08/27/2017   AST 16 08/27/2017   NA 144 08/27/2017   K 4.2 08/27/2017   CL 106  08/27/2017   CREATININE 0.80 08/27/2017   BUN 10 08/27/2017   CO2 31 08/27/2017   TSH 2.65 08/27/2017    Assessment & Plan:   Aaliyana was seen today for follow-up and nasal congestion.  Diagnoses and all orders for this visit:  Insomnia, unspecified type -     temazepam (RESTORIL) 7.5 MG capsule; Take 1 capsule (7.5 mg total) by mouth at bedtime as needed for sleep.  Adjustment disorder with mixed anxiety and depressed mood -     FLUoxetine (PROZAC) 20 MG tablet; Take 1 tablet (20 mg total) by mouth daily. -     temazepam (RESTORIL) 7.5 MG capsule; Take 1 capsule (7.5 mg total) by mouth at bedtime as needed for sleep.  Acute non-recurrent maxillary sinusitis -     guaiFENesin (MUCINEX) 600 MG 12 hr tablet; Take 1 tablet (600 mg total) by mouth 2 (two) times daily as needed for cough or to loosen phlegm. -     fluticasone (FLONASE) 50 MCG/ACT nasal spray; Place 2 sprays into both nostrils daily. -     oxymetazoline (AFRIN NASAL SPRAY) 0.05 % nasal spray; Place 1 spray into both nostrils 2 (two) times daily. Use only for 3days, then stop -     amoxicillin-clavulanate (AUGMENTIN) 875-125 MG tablet; Take 1 tablet by mouth 2 (two) times daily.   I am having Janett Billow start on temazepam, guaiFENesin, fluticasone, oxymetazoline, and amoxicillin-clavulanate. I am also having her maintain her OVER THE COUNTER MEDICATION, polyethylene glycol powder, Vitamin D (Ergocalciferol), and FLUoxetine.  Meds ordered this encounter  Medications  . FLUoxetine (PROZAC) 20 MG tablet    Sig: Take 1 tablet (20 mg total) by mouth daily.    Dispense:  90 tablet    Refill:  0    Order Specific Question:   Supervising Provider    Answer:   Lucille Passy [[id number]]  . temazepam (RESTORIL) 7.5 MG capsule    Sig: Take 1 capsule (7.5 mg total) by mouth at bedtime as needed for sleep.    Dispense:  30 capsule    Refill:  1    Order Specific Question:   Supervising Provider    Answer:   Lucille Passy  [[id number]]  . guaiFENesin (MUCINEX) 600 MG 12 hr tablet    Sig: Take 1 tablet (600 mg total) by mouth 2 (two) times daily as needed for cough or to loosen phlegm.    Dispense:  14 tablet    Refill:  0    Order Specific Question:   Supervising Provider    Answer:   Lucille Passy [[id number]]  . fluticasone (FLONASE) 50 MCG/ACT nasal spray    Sig: Place 2 sprays into both nostrils daily.    Dispense:  16 g    Refill:  0    Order Specific Question:  Supervising Provider    Answer:   Lucille Passy [[id number]]  . oxymetazoline (AFRIN NASAL SPRAY) 0.05 % nasal spray    Sig: Place 1 spray into both nostrils 2 (two) times daily. Use only for 3days, then stop    Dispense:  30 mL    Refill:  0    Order Specific Question:   Supervising Provider    Answer:   Lucille Passy [[id number]]  . amoxicillin-clavulanate (AUGMENTIN) 875-125 MG tablet    Sig: Take 1 tablet by mouth 2 (two) times daily.    Dispense:  14 tablet    Refill:  0    Order Specific Question:   Supervising Provider    Answer:   Lucille Passy [[id number]]    Follow-up: Return in about 2 months (around 11/22/2017) for depression and insomnia.  Wilfred Lacy, NP

## 2017-10-01 IMAGING — CT CT HEAD W/O CM
3 series · 15 of 47 positions shown, 18 images · non-contrast
Comparison: 05/26/2014

CLINICAL DATA: Severe and persistent headache for 4 days. Pain is
on the top of head radiating posteriorly. Sensitivity to bright
lights.

EXAM:
CT HEAD WITHOUT CONTRAST
TECHNIQUE: Contiguous axial images were obtained from the base of the skull
through the vertex without intravenous contrast.

[Series 2: head 5.0 h30s · axial · 0.41mm/px · z∈[-179,-54]mm · 9 of 30 slices shown, 12 images]
[im 3/30  brain]
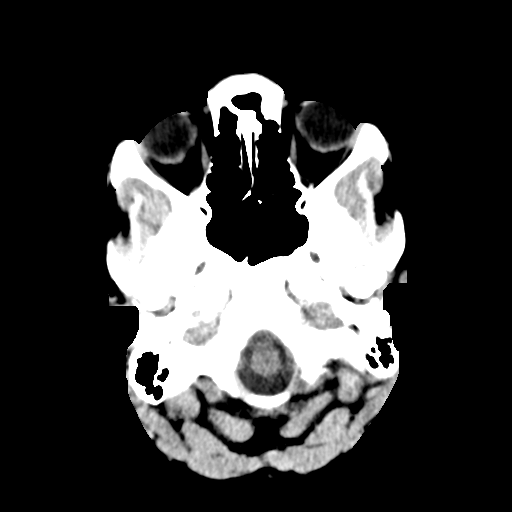
[im 3/30  bone]
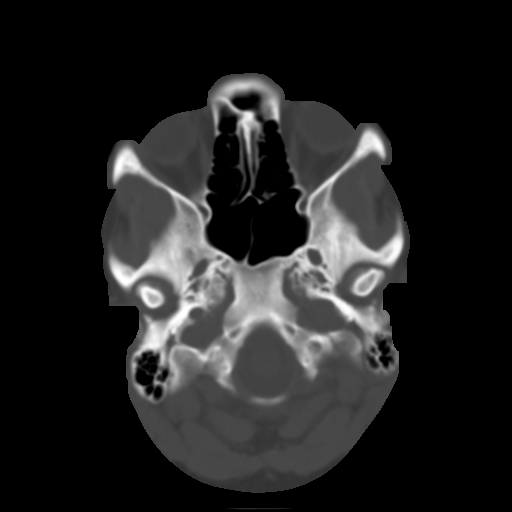
[im 6/30  brain]
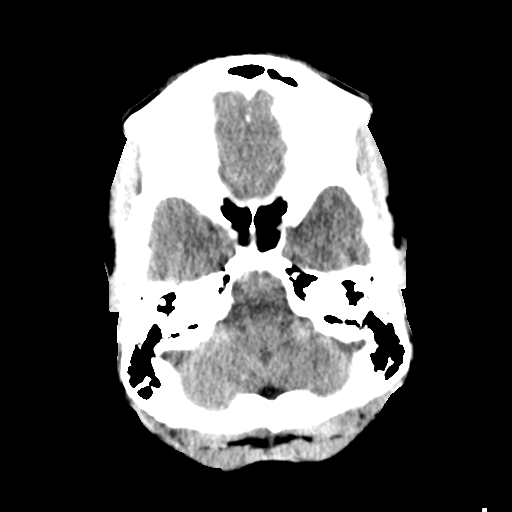
[im 9/30  brain]
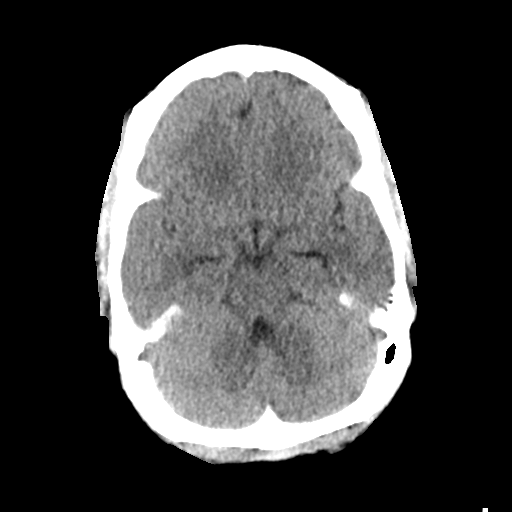
[im 12/30  brain]
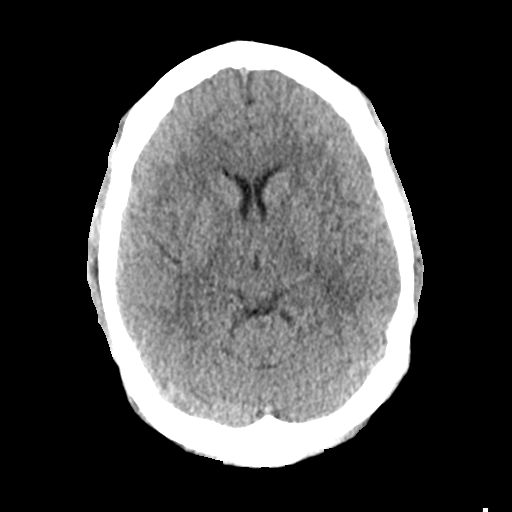
[im 16/30  brain]
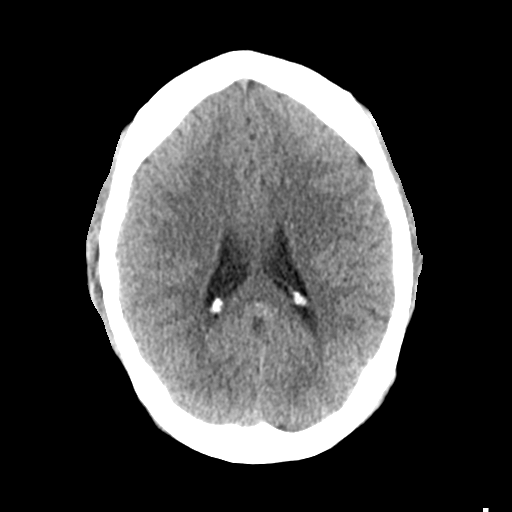
[im 16/30  bone]
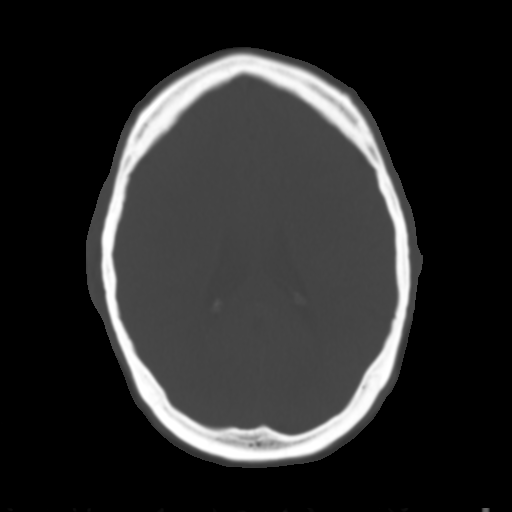
[im 19/30  brain]
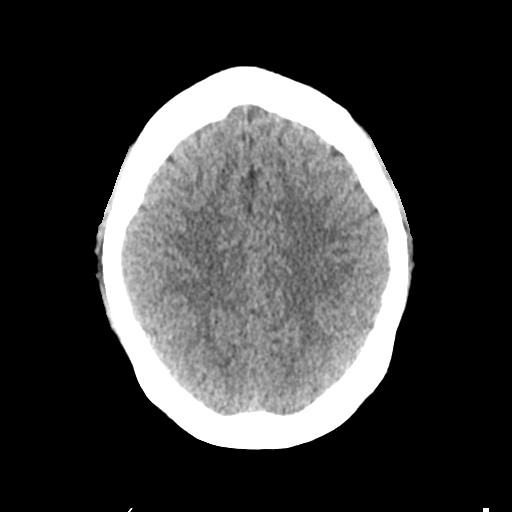
[im 22/30  brain]
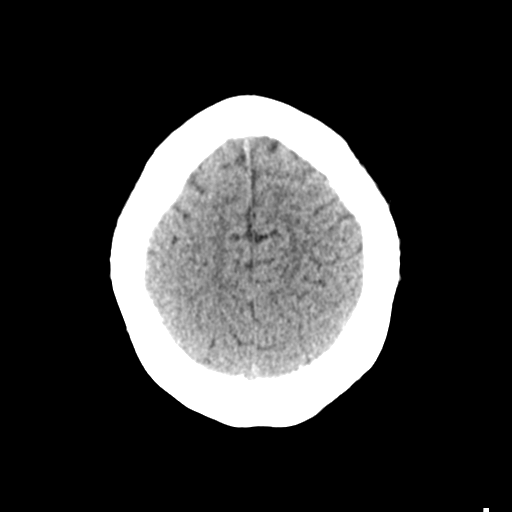
[im 25/30  brain]
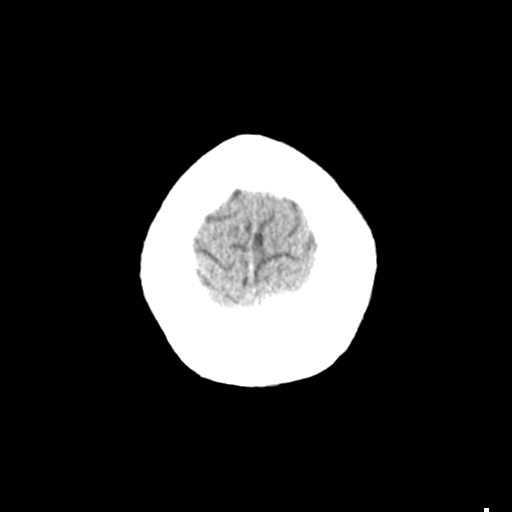
[im 28/30  brain]
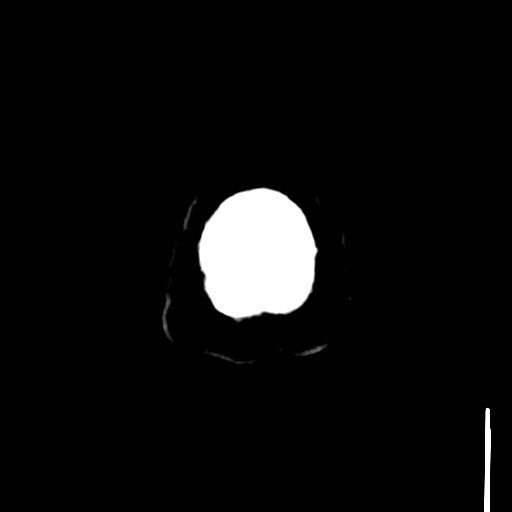
[im 28/30  bone]
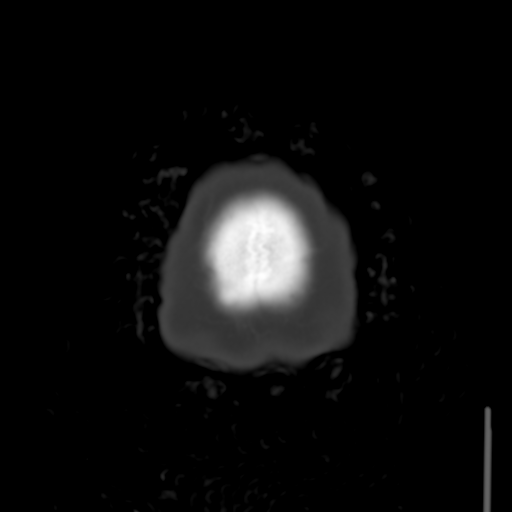

[Series 4: head 3.0 mpr cor · coronal · 0.28mm/px · 3 of 67 slices shown]
[im 23/67  brain]
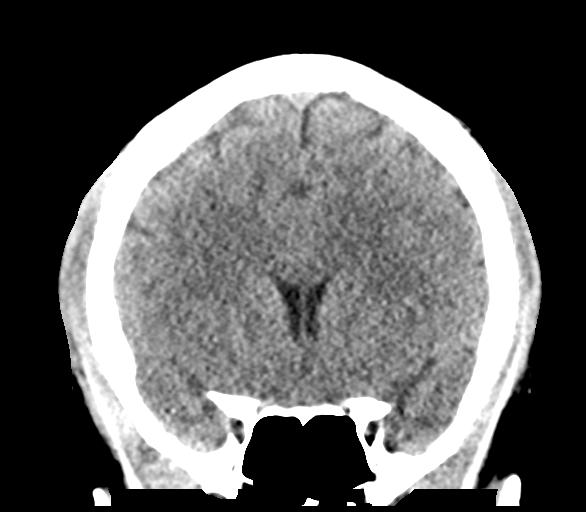
[im 30/67  brain]
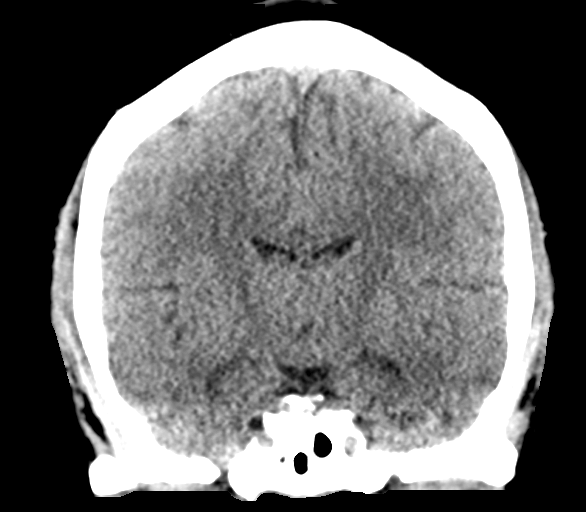
[im 37/67  brain]
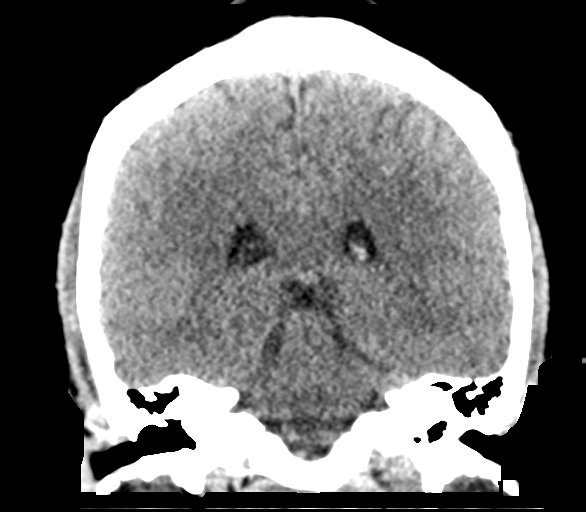

[Series 5: head 3.0 mpr sag · sagittal · 0.29mm/px · 3 of 52 slices shown]
[im 18/52  brain]
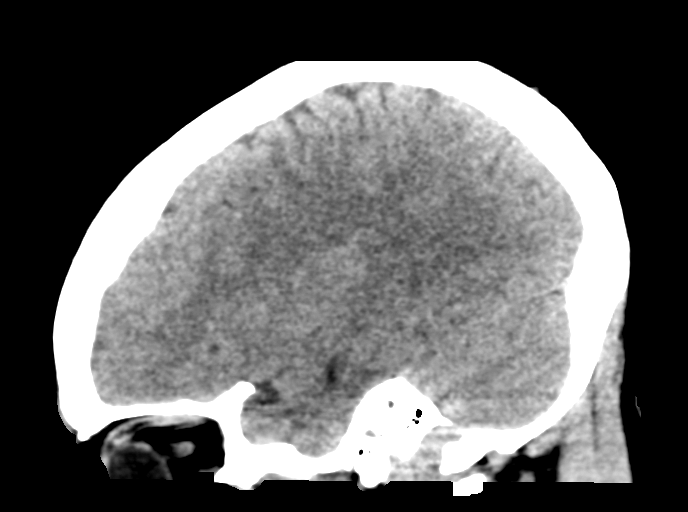
[im 26/52  brain]
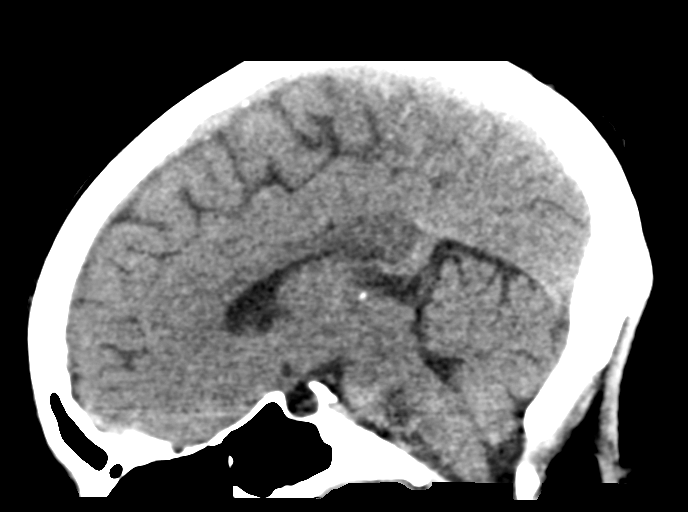
[im 35/52  brain]
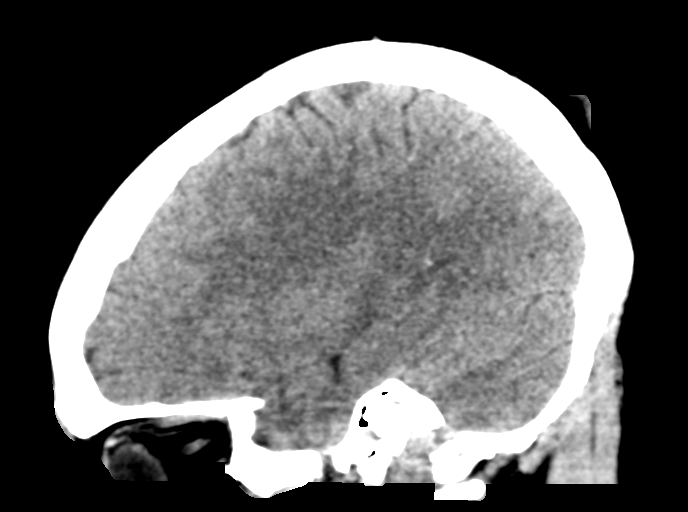

[15 of 47 positions shown; findings below may reference images not displayed]

FINDINGS: Brain: No evidence of acute infarction, hemorrhage, hydrocephalus,
extra-axial collection or mass lesion/mass effect.

Vascular: No hyperdense vessel or unexpected calcification.

Skull: Normal. Negative for fracture or focal lesion.

Sinuses/Orbits: No acute finding.

Other: No significant changes since previous study.
IMPRESSION: No acute intracranial abnormalities.

## 2017-10-02 ENCOUNTER — Telehealth: Payer: Self-pay | Admitting: Nurse Practitioner

## 2017-10-02 NOTE — Telephone Encounter (Signed)
Patient dropped off school physical form that needs to be completed. Patient stated she will pick up form when she comes in for appointment with [facility] on 10/06/17. Form left in Charlotte's box for completion.

## 2017-10-05 ENCOUNTER — Ambulatory Visit: Payer: BLUE CROSS/BLUE SHIELD | Admitting: Psychology

## 2017-10-06 ENCOUNTER — Ambulatory Visit (INDEPENDENT_AMBULATORY_CARE_PROVIDER_SITE_OTHER): Payer: BLUE CROSS/BLUE SHIELD | Admitting: Behavioral Health

## 2017-10-06 ENCOUNTER — Other Ambulatory Visit: Payer: BLUE CROSS/BLUE SHIELD

## 2017-10-06 DIAGNOSIS — Z23 Encounter for immunization: Secondary | ICD-10-CM

## 2017-10-06 DIAGNOSIS — Z111 Encounter for screening for respiratory tuberculosis: Secondary | ICD-10-CM

## 2017-10-06 NOTE — Progress Notes (Signed)
Patient came in clinic for Influenza vaccination & TB Gold lab draw. RN received verbal order from Wilfred Lacy, NP. IM injection was given in the right deltoid. Patient tolerated the injection well. No s/s of a reaction prior to patient leaving the nurse visit.

## 2017-10-06 NOTE — Telephone Encounter (Signed)
Paper work completed, pt is aware.

## 2017-10-07 ENCOUNTER — Encounter: Payer: Self-pay | Admitting: Nurse Practitioner

## 2017-10-07 LAB — UNLABELED: TEST ORDERED ON REQ: 36970

## 2017-10-08 LAB — UNLABELED REQ TIQ: Test Affected: 36970

## 2017-10-08 LAB — QUANTIFERON-TB GOLD PLUS

## 2017-10-08 LAB — SPECIMEN ID NOTIFICATION MISSING 2ND ID

## 2017-10-09 ENCOUNTER — Other Ambulatory Visit: Payer: Self-pay | Admitting: Nurse Practitioner

## 2017-10-09 ENCOUNTER — Other Ambulatory Visit: Payer: Self-pay

## 2017-10-09 DIAGNOSIS — Z111 Encounter for screening for respiratory tuberculosis: Secondary | ICD-10-CM

## 2017-10-10 LAB — QUANTIFERON-TB GOLD PLUS

## 2017-10-12 ENCOUNTER — Other Ambulatory Visit: Payer: Self-pay

## 2017-10-12 ENCOUNTER — Telehealth: Payer: Self-pay | Admitting: Behavioral Health

## 2017-10-12 DIAGNOSIS — Z111 Encounter for screening for respiratory tuberculosis: Secondary | ICD-10-CM

## 2017-10-12 NOTE — Telephone Encounter (Signed)
Placed order for TB Gold lab. Patient is having lab completed at a [facility] testing site. Charmaine Downs., RT(R) has faxed requisition & other required info to the specific site.

## 2017-10-14 LAB — QUANTIFERON-TB GOLD PLUS
NIL: 0.3 IU/mL
QUANTIFERON-TB GOLD PLUS: NEGATIVE
TB2-NIL: <0 IU/mL

## 2017-10-19 ENCOUNTER — Ambulatory Visit: Payer: Self-pay | Admitting: Psychology

## 2017-11-02 ENCOUNTER — Ambulatory Visit: Payer: Self-pay | Admitting: Psychology

## 2017-11-26 ENCOUNTER — Ambulatory Visit: Payer: BLUE CROSS/BLUE SHIELD | Admitting: Nurse Practitioner

## 2017-11-30 ENCOUNTER — Ambulatory Visit: Payer: BLUE CROSS/BLUE SHIELD | Admitting: Nurse Practitioner

## 2017-11-30 ENCOUNTER — Encounter: Payer: Self-pay | Admitting: Nurse Practitioner

## 2017-11-30 VITALS — BP 112/82 | HR 62 | Temp 98.3°F | Ht 66.0 in | Wt 204.0 lb

## 2017-11-30 DIAGNOSIS — F339 Major depressive disorder, recurrent, unspecified: Secondary | ICD-10-CM

## 2017-11-30 DIAGNOSIS — Z716 Tobacco abuse counseling: Secondary | ICD-10-CM

## 2017-11-30 DIAGNOSIS — E559 Vitamin D deficiency, unspecified: Secondary | ICD-10-CM | POA: Diagnosis not present

## 2017-11-30 DIAGNOSIS — F17200 Nicotine dependence, unspecified, uncomplicated: Secondary | ICD-10-CM | POA: Diagnosis not present

## 2017-11-30 DIAGNOSIS — Z87891 Personal history of nicotine dependence: Secondary | ICD-10-CM | POA: Insufficient documentation

## 2017-11-30 MED ORDER — VITAMIN D (ERGOCALCIFEROL) 50 MCG (2000 UT) PO CAPS: 1.0000 | ORAL_CAPSULE | Freq: Every day | ORAL | Status: DC

## 2017-11-30 MED ORDER — BUPROPION HCL ER (SMOKING DET) 150 MG PO TB12: ORAL_TABLET | ORAL | 1 refills | Status: DC

## 2017-11-30 NOTE — Patient Instructions (Signed)
Need to pick quit date. Start zyban 1week prior to quit date. Stop prozac when you start zyban.

## 2017-11-30 NOTE — Progress Notes (Signed)
Subjective:  Patient ID: Caitlin Foster, female    DOB: May 14, 1981  Age: 37 y.o. MRN: [id number]  CC: Follow-up (depression and insomnia.report sleeping is ok--depression consult)  HPI Insomnia: Did not use restoril due to fear of possible side effects.  Depression: Stable with prozac, but she want medication changed to Wellbutrin to help with tobacco cessation.  Tobacco use:  currently smokes 1/2ppd x 38yrs. Smokes menthols-newports. Does not have quit date. Has never tried to quit before.  Outpatient Medications Prior to Visit  Medication Sig Dispense Refill  . polyethylene glycol powder (GLYCOLAX/MIRALAX) powder Take 17 g by mouth daily as needed. 850 g 1  . FLUoxetine (PROZAC) 20 MG tablet Take 1 tablet (20 mg total) by mouth daily. 90 tablet 0  . Vitamin D, Ergocalciferol, (DRISDOL) 50000 units CAPS capsule Take 1 capsule (50,000 Units total) by mouth every 7 (seven) days. 12 capsule 0  . OVER THE COUNTER MEDICATION Apply 1 application topically 3 (three) times daily. "essential oils-multiple ones"    . amoxicillin-clavulanate (AUGMENTIN) 875-125 MG tablet Take 1 tablet by mouth 2 (two) times daily. (Patient not taking: Reported on 11/30/2017) 14 tablet 0  . fluticasone (FLONASE) 50 MCG/ACT nasal spray Place 2 sprays into both nostrils daily. (Patient not taking: Reported on 11/30/2017) 16 g 0  . guaiFENesin (MUCINEX) 600 MG 12 hr tablet Take 1 tablet (600 mg total) by mouth 2 (two) times daily as needed for cough or to loosen phlegm. (Patient not taking: Reported on 11/30/2017) 14 tablet 0  . oxymetazoline (AFRIN NASAL SPRAY) 0.05 % nasal spray Place 1 spray into both nostrils 2 (two) times daily. Use only for 3days, then stop (Patient not taking: Reported on 11/30/2017) 30 mL 0  . temazepam (RESTORIL) 7.5 MG capsule Take 1 capsule (7.5 mg total) by mouth at bedtime as needed for sleep. (Patient not taking: Reported on 11/30/2017) 30 capsule 1   No facility-administered  medications prior to visit.     ROS See HPI  Objective:  BP 112/82   Pulse 62   Temp 98.3 F (36.8 C) (Oral)   Ht 5\' 6"  (1.676 m)   Wt 204 lb (92.5 kg)   SpO2 97%   BMI 32.93 kg/m   BP Readings from Last 3 Encounters:  11/30/17 112/82  09/24/17 122/80  09/12/17 126/81    Wt Readings from Last 3 Encounters:  11/30/17 204 lb (92.5 kg)  09/24/17 198 lb (89.8 kg)  08/24/17 197 lb (89.4 kg)    Physical Exam  Constitutional: She is oriented to person, place, and time. No distress.  Cardiovascular: Normal rate.  Pulmonary/Chest: Effort normal.  Musculoskeletal: She exhibits no edema.  Neurological: She is alert and oriented to person, place, and time.  Psychiatric: She has a normal mood and affect. Her behavior is normal. Thought content normal.  Vitals reviewed.   Lab Results  Component Value Date   WBC 5.7 08/27/2017   HGB 12.6 08/27/2017   HCT 38.2 08/27/2017   PLT 317.0 08/27/2017   GLUCOSE 106 (H) 08/27/2017   CHOL 197 08/27/2017   TRIG 110.0 08/27/2017   HDL 48.10 08/27/2017   LDLCALC 127 (H) 08/27/2017   ALT 13 08/27/2017   AST 16 08/27/2017   NA 144 08/27/2017   K 4.2 08/27/2017   CL 106 08/27/2017   CREATININE 0.80 08/27/2017   BUN 10 08/27/2017   CO2 31 08/27/2017   TSH 2.65 08/27/2017    No results found.  Assessment & Plan:  Caitlin Foster was seen today for follow-up.  Diagnoses and all orders for this visit:  Depression, recurrent ([place]) -     buPROPion (ZYBAN) 150 MG 12 hr tablet; Take 1 tab once a day x 3days, then increase to 1tab every 12hrs continuous  Encounter for tobacco use cessation counseling -     buPROPion (ZYBAN) 150 MG 12 hr tablet; Take 1 tab once a day x 3days, then increase to 1tab every 12hrs continuous  Tobacco use disorder -     buPROPion (ZYBAN) 150 MG 12 hr tablet; Take 1 tab once a day x 3days, then increase to 1tab every 12hrs continuous  Vitamin D deficiency -     Vitamin D, Ergocalciferol, 2000 units CAPS; Take 1  capsule by mouth daily.   I have discontinued Caitlin Foster's Vitamin D (Ergocalciferol), FLUoxetine, temazepam, guaiFENesin, fluticasone, oxymetazoline, and amoxicillin-clavulanate. I am also having her start on buPROPion and Vitamin D (Ergocalciferol). Additionally, I am having her maintain her OVER THE COUNTER MEDICATION and polyethylene glycol powder.  Meds ordered this encounter  Medications  . buPROPion (ZYBAN) 150 MG 12 hr tablet    Sig: Take 1 tab once a day x 3days, then increase to 1tab every 12hrs continuous    Dispense:  60 tablet    Refill:  1    Order Specific Question:   Supervising Provider    Answer:   Lucille Passy [[id number]]  . Vitamin D, Ergocalciferol, 2000 units CAPS    Sig: Take 1 capsule by mouth daily.    Dispense:  30 capsule    Order Specific Question:   Supervising Provider    Answer:   Lucille Passy [[id number]]    Follow-up: Return in about 3 weeks (around 12/21/2017) for depression and tobacco ccessation (repeat BMP and vitamin D).  Wilfred Lacy, NP

## 2017-12-21 ENCOUNTER — Encounter: Payer: Self-pay | Admitting: Nurse Practitioner

## 2017-12-21 ENCOUNTER — Ambulatory Visit: Payer: BLUE CROSS/BLUE SHIELD | Admitting: Nurse Practitioner

## 2017-12-21 VITALS — BP 112/74 | HR 67 | Temp 98.6°F | Ht 66.0 in | Wt 204.0 lb

## 2017-12-21 DIAGNOSIS — E559 Vitamin D deficiency, unspecified: Secondary | ICD-10-CM | POA: Diagnosis not present

## 2017-12-21 DIAGNOSIS — Z716 Tobacco abuse counseling: Secondary | ICD-10-CM | POA: Diagnosis not present

## 2017-12-21 DIAGNOSIS — F339 Major depressive disorder, recurrent, unspecified: Secondary | ICD-10-CM

## 2017-12-21 DIAGNOSIS — F17200 Nicotine dependence, unspecified, uncomplicated: Secondary | ICD-10-CM

## 2017-12-21 LAB — BASIC METABOLIC PANEL
BUN: 9 mg/dL (ref 6–23)
CALCIUM: 9.9 mg/dL (ref 8.4–10.5)
CO2: 31 meq/L (ref 19–32)
CREATININE: 0.87 mg/dL (ref 0.40–1.20)
Chloride: 105 mEq/L (ref 96–112)
GFR: 95.33 mL/min (ref >60.00)
Glucose, Bld: 92 mg/dL (ref 70–99)
Potassium: 4.6 mEq/L (ref 3.5–5.1)
Sodium: 144 mEq/L (ref 135–145)

## 2017-12-21 MED ORDER — BUPROPION HCL ER (XL) 150 MG PO TB24: 150.0000 mg | ORAL_TABLET | Freq: Every day | ORAL | 3 refills | Status: DC

## 2017-12-21 NOTE — Progress Notes (Signed)
Subjective:  Patient ID: Caitlin Foster, female    DOB: November 13, 1980  Age: 37 y.o. MRN: [id number]  CC: Follow-up (3 wk follwo up/ ok with lab)   HPI   Tobacco use:  no cigarette x 6days. Reports Headache with higher dose: 150mg  BID. So she has been taking 150mg  once a day.   Depression Reports Improved sleep, and mood.  Reviewed social history.  Outpatient Medications Prior to Visit  Medication Sig Dispense Refill  . polyethylene glycol powder (GLYCOLAX/MIRALAX) powder Take 17 g by mouth daily as needed. 850 g 1  . buPROPion (ZYBAN) 150 MG 12 hr tablet Take 1 tab once a day x 3days, then increase to 1tab every 12hrs continuous 60 tablet 1  . Vitamin D, Ergocalciferol, 2000 units CAPS Take 1 capsule by mouth daily. 30 capsule   . OVER THE COUNTER MEDICATION Apply 1 application topically 3 (three) times daily. "essential oils-multiple ones"     No facility-administered medications prior to visit.     ROS See HPI  Objective:  BP 112/74   Pulse 67   Temp 98.6 F (37 C) (Oral)   Ht 5\' 6"  (1.676 m)   Wt 204 lb (92.5 kg)   SpO2 97%   BMI 32.93 kg/m   BP Readings from Last 3 Encounters:  12/21/17 112/74  11/30/17 112/82  09/24/17 122/80    Wt Readings from Last 3 Encounters:  12/21/17 204 lb (92.5 kg)  11/30/17 204 lb (92.5 kg)  09/24/17 198 lb (89.8 kg)    Physical Exam  Constitutional: She is oriented to person, place, and time. No distress.  Neck: Normal range of motion. Neck supple. No thyromegaly present.  Cardiovascular: Normal rate and regular rhythm.  Pulmonary/Chest: Effort normal and breath sounds normal.  Neurological: She is alert and oriented to person, place, and time.  Skin: Skin is warm and dry.  Psychiatric: She has a normal mood and affect. Her behavior is normal. Thought content normal.  Vitals reviewed.   Lab Results  Component Value Date   WBC 5.7 08/27/2017   HGB 12.6 08/27/2017   HCT 38.2 08/27/2017   PLT 317.0 08/27/2017   GLUCOSE 92 12/21/2017   CHOL 197 08/27/2017   TRIG 110.0 08/27/2017   HDL 48.10 08/27/2017   LDLCALC 127 (H) 08/27/2017   ALT 13 08/27/2017   AST 16 08/27/2017   NA 144 12/21/2017   K 4.6 12/21/2017   CL 105 12/21/2017   CREATININE 0.87 12/21/2017   BUN 9 12/21/2017   CO2 31 12/21/2017   TSH 2.65 08/27/2017    No results found.  Assessment & Plan:   Caitlin Foster was seen today for follow-up.  Diagnoses and all orders for this visit:  Depression, recurrent ([place]) -     Basic metabolic panel -     buPROPion (WELLBUTRIN XL) 150 MG 24 hr tablet; Take 1 tablet (150 mg total) by mouth daily.  Encounter for tobacco use cessation counseling -     buPROPion (WELLBUTRIN XL) 150 MG 24 hr tablet; Take 1 tablet (150 mg total) by mouth daily.  Tobacco use disorder -     buPROPion (WELLBUTRIN XL) 150 MG 24 hr tablet; Take 1 tablet (150 mg total) by mouth daily.  Vitamin D deficiency -     VITAMIN D 25 Hydroxy (Vit-D Deficiency, Fractures) -     Vitamin D, Ergocalciferol, (DRISDOL) 50000 units CAPS capsule; Take 1 capsule (50,000 Units total) by mouth every 7 (seven) days.   I  have discontinued Gionni T. Walz's buPROPion and Vitamin D (Ergocalciferol). I am also having her start on buPROPion and Vitamin D (Ergocalciferol). Additionally, I am having her maintain her OVER THE COUNTER MEDICATION and polyethylene glycol powder.  Meds ordered this encounter  Medications  . buPROPion (WELLBUTRIN XL) 150 MG 24 hr tablet    Sig: Take 1 tablet (150 mg total) by mouth daily.    Dispense:  30 tablet    Refill:  3    Order Specific Question:   Supervising Provider    Answer:   Lucille Passy [[id number]]  . Vitamin D, Ergocalciferol, (DRISDOL) 50000 units CAPS capsule    Sig: Take 1 capsule (50,000 Units total) by mouth every 7 (seven) days.    Dispense:  6 capsule    Refill:  0    Order Specific Question:   Supervising Provider    Answer:   Lucille Passy [[id number]]    Follow-up: Return in about 3  months (around 03/23/2018) for depression and tobacco use.  Wilfred Lacy, NP

## 2017-12-22 ENCOUNTER — Encounter: Payer: Self-pay | Admitting: Nurse Practitioner

## 2017-12-22 LAB — VITAMIN D 25 HYDROXY (VIT D DEFICIENCY, FRACTURES): VITD: 18.46 ng/mL — AB (ref 30.00–100.00)

## 2017-12-22 MED ORDER — VITAMIN D (ERGOCALCIFEROL) 1.25 MG (50000 UNIT) PO CAPS: 50000.0000 [IU] | ORAL_CAPSULE | ORAL | 0 refills | Status: DC

## 2017-12-22 NOTE — Patient Instructions (Signed)
Persistent low vitamin D. Additional supplement sent. Normal BMP. F/u in 23months

## 2018-01-24 ENCOUNTER — Other Ambulatory Visit: Payer: Self-pay | Admitting: Nurse Practitioner

## 2018-02-13 ENCOUNTER — Other Ambulatory Visit: Payer: Self-pay | Admitting: Nurse Practitioner

## 2018-02-13 DIAGNOSIS — E559 Vitamin D deficiency, unspecified: Secondary | ICD-10-CM

## 2018-02-15 NOTE — Telephone Encounter (Signed)
Rx denied. Pt needs to take OTC Vit D 1000 iu daily until repeat labs again in 03/2018.

## 2019-08-14 ENCOUNTER — Other Ambulatory Visit: Payer: Self-pay

## 2019-08-14 ENCOUNTER — Encounter (HOSPITAL_COMMUNITY): Payer: Self-pay

## 2019-08-14 ENCOUNTER — Ambulatory Visit (HOSPITAL_COMMUNITY)
Admission: EM | Admit: 2019-08-14 | Discharge: 2019-08-14 | Disposition: A | Discharge status: Home / Self Care | Payer: 59 | Attending: Emergency Medicine | Admitting: Emergency Medicine

## 2019-08-14 DIAGNOSIS — E559 Vitamin D deficiency, unspecified: Secondary | ICD-10-CM | POA: Insufficient documentation

## 2019-08-14 DIAGNOSIS — F329 Major depressive disorder, single episode, unspecified: Secondary | ICD-10-CM | POA: Diagnosis not present

## 2019-08-14 DIAGNOSIS — Z833 Family history of diabetes mellitus: Secondary | ICD-10-CM | POA: Diagnosis not present

## 2019-08-14 DIAGNOSIS — B349 Viral infection, unspecified: Secondary | ICD-10-CM | POA: Insufficient documentation

## 2019-08-14 DIAGNOSIS — Z881 Allergy status to other antibiotic agents status: Secondary | ICD-10-CM | POA: Insufficient documentation

## 2019-08-14 DIAGNOSIS — Z20822 Contact with and (suspected) exposure to covid-19: Secondary | ICD-10-CM | POA: Diagnosis not present

## 2019-08-14 DIAGNOSIS — G8929 Other chronic pain: Secondary | ICD-10-CM | POA: Diagnosis not present

## 2019-08-14 DIAGNOSIS — F17210 Nicotine dependence, cigarettes, uncomplicated: Secondary | ICD-10-CM | POA: Insufficient documentation

## 2019-08-14 DIAGNOSIS — Z818 Family history of other mental and behavioral disorders: Secondary | ICD-10-CM | POA: Diagnosis not present

## 2019-08-14 DIAGNOSIS — Z79899 Other long term (current) drug therapy: Secondary | ICD-10-CM | POA: Insufficient documentation

## 2019-08-14 DIAGNOSIS — Z882 Allergy status to sulfonamides status: Secondary | ICD-10-CM | POA: Insufficient documentation

## 2019-08-14 DIAGNOSIS — M419 Scoliosis, unspecified: Secondary | ICD-10-CM | POA: Insufficient documentation

## 2019-08-14 LAB — POC SARS CORONAVIRUS 2 AG -  ED
SARS Coronavirus 2 Ag: NEGATIVE
SARS Coronavirus 2 Ag: NEGATIVE

## 2019-08-14 LAB — POC SARS CORONAVIRUS 2 AG: SARS Coronavirus 2 Ag: NEGATIVE

## 2019-08-14 NOTE — ED Triage Notes (Signed)
Pt presents with productive cough and head pressure X 2 days.

## 2019-08-14 NOTE — Discharge Instructions (Addendum)
This is most likely some sort of viral illness Your rapid Covid test was negative here.  We are doing a send out test for better confirmation. You can take over-the-counter medication for your symptoms Mucinex is a good choice for cough and chest congestion. He could do Flonase nasal spray and Zyrtec for sinus type symptoms. Tylenol and ibuprofen for body aches and fever Follow up as needed for continued or worsening symptoms

## 2019-08-15 ENCOUNTER — Telehealth: Payer: 59

## 2019-08-15 NOTE — ED Provider Notes (Signed)
[place]    CSN: [id number] Arrival date & time: 08/14/19  1651      History   Chief Complaint Chief Complaint  Patient presents with  . Headache  . Cough    HPI Caitlin Foster is a 39 y.o. female.   Pt is a 38 year old female that presents with 2 days of cough, nasal congestion, headache. Symptoms have been constant. She has been using cough drops for symptoms. No fever, chills, loss of taste of smell, diarrhea. No known sick contacts or recent travels.   ROS per HPI      Past Medical History:  Diagnosis Date  . Scoliosis     Patient Active Problem List   Diagnosis Date Noted  . Tobacco use disorder 11/30/2017  . Encounter for tobacco use cessation counseling 11/30/2017  . Chronic back pain 09/24/2017  . Headache 09/24/2017  . Vitamin D deficiency 09/24/2017  . Depression, recurrent ([place]) 09/24/2017  . Hair loss 07/09/2012    Past Surgical History:  Procedure Laterality Date  . BACK SURGERY     Harrington Rods secondary to scoliosis  . scoli      OB History   No obstetric history on file.      Home Medications    Prior to Admission medications   Medication Sig Start Date End Date Taking? Authorizing Provider  buPROPion (WELLBUTRIN XL) 150 MG 24 hr tablet Take 1 tablet (150 mg total) by mouth daily. 12/21/17   Nche, Charlene Brooke, NP  OVER THE COUNTER MEDICATION Apply 1 application topically 3 (three) times daily. "essential oils-multiple ones"    [provider]  polyethylene glycol powder (GLYCOLAX/MIRALAX) powder Take 17 g by mouth daily as needed. 08/24/17   Nche, Charlene Brooke, NP  Vitamin D, Ergocalciferol, (DRISDOL) 50000 units CAPS capsule Take 1 capsule (50,000 Units total) by mouth every 7 (seven) days. 12/22/17   Nche, Charlene Brooke, NP    Family History Family History  Problem Relation Age of Onset  . Diabetes Mother   . Depression Mother   . Heart attack Paternal Aunt   . Diabetes Maternal Grandmother   .  Depression Maternal Aunt     Social History Social History   Tobacco Use  . Smoking status: Current Every Day Smoker    Packs/day: 0.50    Types: E-cigarettes    Last attempt to quit: 12/07/2011    Years since quitting: 7.6  . Smokeless tobacco: Never Used  . Tobacco comment: spoke newports, menthol  Substance Use Topics  . Alcohol use: Yes    Comment: social  . Drug use: No     Allergies   Levaquin  [levofloxacin in d5w], Levofloxacin, Sulfa antibiotics, and Sulfamethoxazole-trimethoprim   Review of Systems Review of Systems   Physical Exam Triage Vital Signs ED Triage Vitals  Enc Vitals Group     BP 08/14/19 1809 (!) 151/90     Pulse Rate 08/14/19 1809 90     Resp 08/14/19 1809 20     Temp 08/14/19 1809 98.9 F (37.2 C)     Temp Source 08/14/19 1809 Oral     SpO2 08/14/19 1809 96 %     Weight --      Height --      Head Circumference --      Peak Flow --      Pain Score 08/14/19 1808 4     Pain Loc --      Pain Edu? --  Excl. in GC? --    No data found.  Updated Vital Signs BP (!) 151/90 (BP Location: Left Arm)   Pulse 90   Temp 98.9 F (37.2 C) (Oral)   Resp 20   LMP 08/05/2019   SpO2 96%   Visual Acuity Right Eye Distance:   Left Eye Distance:   Bilateral Distance:    Right Eye Near:   Left Eye Near:    Bilateral Near:     Physical Exam Vitals and nursing note reviewed.  Constitutional:      General: She is not in acute distress.    Appearance: Normal appearance. She is not ill-appearing, toxic-appearing or diaphoretic.  HENT:     Head: Normocephalic.     Right Ear: Tympanic membrane and ear canal normal.     Left Ear: Tympanic membrane and ear canal normal.     Nose: Congestion present.     Mouth/Throat:     Pharynx: Oropharynx is clear.  Eyes:     Conjunctiva/sclera: Conjunctivae normal.  Cardiovascular:     Rate and Rhythm: Normal rate and regular rhythm.  Pulmonary:     Effort: Pulmonary effort is normal.     Breath  sounds: Normal breath sounds.  Musculoskeletal:        General: Normal range of motion.     Cervical back: Normal range of motion.  Skin:    General: Skin is warm and dry.     Findings: No rash.  Neurological:     Mental Status: She is alert.  Psychiatric:        Mood and Affect: Mood normal.      UC Treatments / Results  Labs (all labs ordered are listed, but only abnormal results are displayed) Labs Reviewed  NOVEL CORONAVIRUS, NAA (HOSP ORDER, SEND-OUT TO REF LAB; TAT 18-24 HRS)  POC SARS CORONAVIRUS 2 AG -  ED  POC SARS CORONAVIRUS 2 AG  POC SARS CORONAVIRUS 2 AG -  ED    EKG   Radiology No results found.  Procedures Procedures (including critical care time)  Medications Ordered in UC Medications - No data to display  Initial Impression / Assessment and Plan / UC Course  I have reviewed the triage vital signs and the nursing notes.  Pertinent labs & imaging results that were available during my care of the patient were reviewed by me and considered in my medical decision making (see chart for details).     Viral Illness-OTC meds as needed.  Rapid COVID negative Send out test done Quarantine precautions given Final Clinical Impressions(s) / UC Diagnoses   Final diagnoses:  Viral illness     Discharge Instructions     This is most likely some sort of viral illness Your rapid Covid test was negative here.  We are doing a send out test for better confirmation. You can take over-the-counter medication for your symptoms Mucinex is a good choice for cough and chest congestion. He could do Flonase nasal spray and Zyrtec for sinus type symptoms. Tylenol and ibuprofen for body aches and fever Follow up as needed for continued or worsening symptoms      ED Prescriptions    None     PDMP not reviewed this encounter.   Orvan July, NP 08/15/19 1712

## 2019-08-17 LAB — NOVEL CORONAVIRUS, NAA (HOSP ORDER, SEND-OUT TO REF LAB; TAT 18-24 HRS): SARS-CoV-2, NAA: NOT DETECTED

## 2019-08-22 ENCOUNTER — Other Ambulatory Visit: Payer: Self-pay

## 2019-08-23 ENCOUNTER — Encounter: Payer: Self-pay | Admitting: Nurse Practitioner

## 2019-08-23 ENCOUNTER — Other Ambulatory Visit (HOSPITAL_COMMUNITY)
Admission: RE | Admit: 2019-08-23 | Discharge: 2019-08-23 | Disposition: A | Discharge status: Home / Self Care | Payer: 59 | Source: Ambulatory Visit | Attending: Nurse Practitioner | Admitting: Nurse Practitioner

## 2019-08-23 ENCOUNTER — Ambulatory Visit (INDEPENDENT_AMBULATORY_CARE_PROVIDER_SITE_OTHER): Payer: 59 | Admitting: Nurse Practitioner

## 2019-08-23 VITALS — BP 112/82 | HR 85 | Temp 97.8°F | Ht 66.0 in | Wt 205.0 lb

## 2019-08-23 DIAGNOSIS — Z124 Encounter for screening for malignant neoplasm of cervix: Secondary | ICD-10-CM | POA: Insufficient documentation

## 2019-08-23 DIAGNOSIS — Z113 Encounter for screening for infections with a predominantly sexual mode of transmission: Secondary | ICD-10-CM

## 2019-08-23 DIAGNOSIS — Z0001 Encounter for general adult medical examination with abnormal findings: Secondary | ICD-10-CM

## 2019-08-23 DIAGNOSIS — E782 Mixed hyperlipidemia: Secondary | ICD-10-CM

## 2019-08-23 DIAGNOSIS — Z0000 Encounter for general adult medical examination without abnormal findings: Secondary | ICD-10-CM | POA: Diagnosis not present

## 2019-08-23 DIAGNOSIS — N760 Acute vaginitis: Secondary | ICD-10-CM

## 2019-08-23 DIAGNOSIS — E559 Vitamin D deficiency, unspecified: Secondary | ICD-10-CM

## 2019-08-23 DIAGNOSIS — A5901 Trichomonal vulvovaginitis: Secondary | ICD-10-CM | POA: Diagnosis not present

## 2019-08-23 DIAGNOSIS — F339 Major depressive disorder, recurrent, unspecified: Secondary | ICD-10-CM | POA: Diagnosis not present

## 2019-08-23 LAB — LIPID PANEL
Cholesterol: 218 mg/dL — ABNORMAL HIGH (ref 0–200)
HDL: 44.7 mg/dL (ref >39.00)
LDL Cholesterol: 141 mg/dL — ABNORMAL HIGH (ref 0–99)
NonHDL: 173.18
Total CHOL/HDL Ratio: 5
Triglycerides: 160 mg/dL — ABNORMAL HIGH (ref 0.0–149.0)
VLDL: 32 mg/dL (ref 0.0–40.0)

## 2019-08-23 LAB — COMPREHENSIVE METABOLIC PANEL
ALT: 17 U/L (ref 0–35)
AST: 16 U/L (ref 0–37)
Albumin: 4.1 g/dL (ref 3.5–5.2)
Alkaline Phosphatase: 78 U/L (ref 39–117)
BUN: 8 mg/dL (ref 6–23)
CO2: 28 mEq/L (ref 19–32)
Calcium: 9.5 mg/dL (ref 8.4–10.5)
Chloride: 103 mEq/L (ref 96–112)
Creatinine, Ser: 0.82 mg/dL (ref 0.40–1.20)
GFR: 95.13 mL/min (ref >60.00)
Glucose, Bld: 104 mg/dL — ABNORMAL HIGH (ref 70–99)
Potassium: 3.9 mEq/L (ref 3.5–5.1)
Sodium: 137 mEq/L (ref 135–145)
Total Bilirubin: 0.5 mg/dL (ref 0.2–1.2)
Total Protein: 7.4 g/dL (ref 6.0–8.3)

## 2019-08-23 LAB — CBC
HCT: 37.8 % (ref 36.0–46.0)
Hemoglobin: 12.4 g/dL (ref 12.0–15.0)
MCHC: 32.9 g/dL (ref 30.0–36.0)
MCV: 88.6 fl (ref 78.0–100.0)
Platelets: 331 10*3/uL (ref 150.0–400.0)
RBC: 4.27 Mil/uL (ref 3.87–5.11)
RDW: 13.5 % (ref 11.5–15.5)
WBC: 6.4 10*3/uL (ref 4.0–10.5)

## 2019-08-23 LAB — TSH: TSH: 2.6 u[IU]/mL (ref 0.35–4.50)

## 2019-08-23 NOTE — Patient Instructions (Addendum)
Negative HIV Normal cbc, tsh, and cmp Normal PAP with negative HPV Wet prep indicates BV, yeast and trichomonasis. Trichomonasis is an STD, so notify your partner. Metronidazole and diflucan sent. Abnormal lipid panel: elevated triglyceride, TC and LDL. Need to make changes to diet and start regular exercise (DASH diet). Return to lab for repeat urine cytology after completion of metronidazole. No sexual activity till completion of treatment  Preventive Care 35-49 Years Old, Female Preventive care refers to visits with your health care provider and lifestyle choices that can promote health and wellness. This includes:  A yearly physical exam. This may also be called an annual well check.  Regular dental visits and eye exams.  Immunizations.  Screening for certain conditions.  Healthy lifestyle choices, such as eating a healthy diet, getting regular exercise, not using drugs or products that contain nicotine and tobacco, and limiting alcohol use. What can I expect for my preventive care visit? Physical exam Your health care provider will check your:  Height and weight. This may be used to calculate body mass index (BMI), which tells if you are at a healthy weight.  Heart rate and blood pressure.  Skin for abnormal spots. Counseling Your health care provider may ask you questions about your:  Alcohol, tobacco, and drug use.  Emotional well-being.  Home and relationship well-being.  Sexual activity.  Eating habits.  Work and work Statistician.  Method of birth control.  Menstrual cycle.  Pregnancy history. What immunizations do I need?  Influenza (flu) vaccine  This is recommended every year. Tetanus, diphtheria, and pertussis (Tdap) vaccine  You may need a Td booster every 10 years. Varicella (chickenpox) vaccine  You may need this if you have not been vaccinated. Human papillomavirus (HPV) vaccine  If recommended by your health care provider, you may need  three doses over 6 months. Measles, mumps, and rubella (MMR) vaccine  You may need at least one dose of MMR. You may also need a second dose. Meningococcal conjugate (MenACWY) vaccine  One dose is recommended if you are age 24-21 years and a first-year college student living in a residence hall, or if you have one of several medical conditions. You may also need additional booster doses. Pneumococcal conjugate (PCV13) vaccine  You may need this if you have certain conditions and were not previously vaccinated. Pneumococcal polysaccharide (PPSV23) vaccine  You may need one or two doses if you smoke cigarettes or if you have certain conditions. Hepatitis A vaccine  You may need this if you have certain conditions or if you travel or work in places where you may be exposed to hepatitis A. Hepatitis B vaccine  You may need this if you have certain conditions or if you travel or work in places where you may be exposed to hepatitis B. Haemophilus influenzae type b (Hib) vaccine  You may need this if you have certain conditions. You may receive vaccines as individual doses or as more than one vaccine together in one shot (combination vaccines). Talk with your health care provider about the risks and benefits of combination vaccines. What tests do I need?  Blood tests  Lipid and cholesterol levels. These may be checked every 5 years starting at age 51.  Hepatitis C test.  Hepatitis B test. Screening  Diabetes screening. This is done by checking your blood sugar (glucose) after you have not eaten for a while (fasting).  Sexually transmitted disease (STD) testing.  BRCA-related cancer screening. This may be done if you have  a family history of breast, ovarian, tubal, or peritoneal cancers.  Pelvic exam and Pap test. This may be done every 3 years starting at age 102. Starting at age 40, this may be done every 5 years if you have a Pap test in combination with an HPV test. Talk with your  health care provider about your test results, treatment options, and if necessary, the need for more tests. Follow these instructions at home: Eating and drinking   Eat a diet that includes fresh fruits and vegetables, whole grains, lean protein, and low-fat dairy.  Take vitamin and mineral supplements as recommended by your health care provider.  Do not drink alcohol if: ? Your health care provider tells you not to drink. ? You are pregnant, may be pregnant, or are planning to become pregnant.  If you drink alcohol: ? Limit how much you have to 0-1 drink a day. ? Be aware of how much alcohol is in your drink. In the [place] one drink equals one 12 oz bottle of beer (355 mL), one 5 oz glass of wine (148 mL), or one 1 oz glass of hard liquor (44 mL). Lifestyle  Take daily care of your teeth and gums.  Stay active. Exercise for at least 30 minutes on 5 or more days each week.  Do not use any products that contain nicotine or tobacco, such as cigarettes, e-cigarettes, and chewing tobacco. If you need help quitting, ask your health care provider.  If you are sexually active, practice safe sex. Use a condom or other form of birth control (contraception) in order to prevent pregnancy and STIs (sexually transmitted infections). If you plan to become pregnant, see your health care provider for a preconception visit. What's next?  Visit your health care provider once a year for a well check visit.  Ask your health care provider how often you should have your eyes and teeth checked.  Stay up to date on all vaccines. This information is not intended to replace advice given to you by your health care provider. Make sure you discuss any questions you have with your health care provider. Document Revised: 04/01/2018 Document Reviewed: 04/01/2018 Elsevier Patient Education  2020 [employer].

## 2019-08-23 NOTE — Progress Notes (Signed)
Subjective:    Patient ID: Caitlin Foster, female    DOB: 09-09-1980, 39 y.o.   MRN: [id number]  Patient presents today for complete physical and eval of vaginal discharge  HPI  Sexual History (orientation,birth control, marital status, STD):married, sexually active, no contraception  Depression/Suicide: Depression screen [facility] 2/9 08/23/2019 09/24/2017 08/24/2017 07/08/2012  Decreased Interest 0 2 1 0  Down, Depressed, Hopeless 0 2 1 0  PHQ - 2 Score 0 4 2 0  Altered sleeping - 2 2 -  Tired, decreased energy - 1 2 -  Change in appetite - 2 2 -  Feeling bad or failure about yourself  - 1 2 -  Trouble concentrating - 2 2 -  Moving slowly or fidgety/restless - 1 1 -  Suicidal thoughts - 0 0 -  PHQ-9 Score - 13 13 -   Vision:up to date  Dental:up to date  Immunizations: (TDAP, Hep C screen, Pneumovax, Influenza, zoster)  Health Maintenance  Topic Date Due  . Pap Smear  08/22/2022  . Tetanus Vaccine  06/04/2024  . Flu Shot  Completed  . HIV Screening  Completed   Diet:regular  Weight:  Wt Readings from Last 3 Encounters:  08/23/19 205 lb (93 kg)  12/21/17 204 lb (92.5 kg)  11/30/17 204 lb (92.5 kg)   Exercise:none  Fall Risk: Fall Risk  08/23/2019 08/24/2017 07/08/2012  Falls in the past year? 0 No No   Medications and allergies reviewed with patient and updated if appropriate.  Patient Active Problem List   Diagnosis Date Noted  . Mixed hyperlipidemia 08/25/2019  . Trichomoniasis of vagina 08/25/2019  . Tobacco use disorder 11/30/2017  . Encounter for tobacco use cessation counseling 11/30/2017  . Chronic back pain 09/24/2017  . Headache 09/24/2017  . Vitamin D deficiency 09/24/2017  . Depression, recurrent ([place]) 09/24/2017  . Hair loss 07/09/2012    Current Outpatient Medications on File Prior to Visit  Medication Sig Dispense Refill  . polyethylene glycol powder (GLYCOLAX/MIRALAX) powder Take 17 g by mouth daily as needed. 850 g 1  . Vitamin D,  Ergocalciferol, (DRISDOL) 50000 units CAPS capsule Take 1 capsule (50,000 Units total) by mouth every 7 (seven) days. 6 capsule 0   No current facility-administered medications on file prior to visit.    Past Medical History:  Diagnosis Date  . Scoliosis     Past Surgical History:  Procedure Laterality Date  . BACK SURGERY     Harrington Rods secondary to scoliosis  . scoli      Social History   Socioeconomic History  . Marital status: Married    Spouse name: Not on file  . Number of children: 2  . Years of education: Not on file  . Highest education level: Not on file  Occupational History  . Occupation: LPN  Tobacco Use  . Smoking status: Current Every Day Smoker    Packs/day: 0.50    Types: E-cigarettes    Last attempt to quit: 12/07/2011    Years since quitting: 7.7  . Smokeless tobacco: Never Used  . Tobacco comment: spoke newports, menthol  Substance and Sexual Activity  . Alcohol use: Yes    Comment: social  . Drug use: No  . Sexual activity: Yes    Birth control/protection: None  Other Topics Concern  . Not on file  Social History Narrative  . Not on file   Social Determinants of Health   Financial Resource Strain:   . Difficulty of Paying  Living Expenses: Not on file  Food Insecurity:   . Worried About Charity fundraiser in the Last Year: Not on file  . Ran Out of Food in the Last Year: Not on file  Transportation Needs:   . Lack of Transportation (Medical): Not on file  . Lack of Transportation (Non-Medical): Not on file  Physical Activity:   . Days of Exercise per Week: Not on file  . Minutes of Exercise per Session: Not on file  Stress:   . Feeling of Stress : Not on file  Social Connections:   . Frequency of Communication with Friends and Family: Not on file  . Frequency of Social Gatherings with Friends and Family: Not on file  . Attends Religious Services: Not on file  . Active Member of Clubs or Organizations: Not on file  . Attends  Archivist Meetings: Not on file  . Marital Status: Not on file    Family History  Problem Relation Age of Onset  . Diabetes Mother   . Depression Mother   . Heart attack Paternal Aunt   . Diabetes Maternal Grandmother   . Depression Maternal Aunt         Review of Systems  Constitutional: Negative for fever, malaise/fatigue and weight loss.  HENT: Negative for congestion and sore throat.   Eyes:       Negative for visual changes  Respiratory: Negative for cough and shortness of breath.   Cardiovascular: Negative for chest pain, palpitations and leg swelling.  Gastrointestinal: Negative for blood in stool, constipation, diarrhea and heartburn.  Genitourinary: Negative for dysuria, frequency and urgency.  Musculoskeletal: Negative for falls, joint pain and myalgias.  Skin: Negative for rash.  Neurological: Negative for dizziness, sensory change and headaches.  Endo/Heme/Allergies: Does not bruise/bleed easily.  Psychiatric/Behavioral: Negative for depression, substance abuse and suicidal ideas. The patient is not nervous/anxious and does not have insomnia.     Objective:   Vitals:   08/23/19 0939  BP: 112/82  Pulse: 85  Temp: 97.8 F (36.6 C)  SpO2: 98%    Body mass index is 33.09 kg/m.   Physical Examination:  Physical Exam Vitals reviewed. Exam conducted with a chaperone present.  Constitutional:      General: She is not in acute distress.    Appearance: She is obese.  HENT:     Right Ear: Tympanic membrane, ear canal and external ear normal.     Left Ear: Tympanic membrane, ear canal and external ear normal.     Mouth/Throat:     Pharynx: No oropharyngeal exudate.  Eyes:     General: No scleral icterus.    Extraocular Movements: Extraocular movements intact.     Conjunctiva/sclera: Conjunctivae normal.     Pupils: Pupils are equal, round, and reactive to light.  Neck:     Thyroid: No thyroid mass, thyromegaly or thyroid tenderness.    Cardiovascular:     Rate and Rhythm: Normal rate and regular rhythm.     Heart sounds: Normal heart sounds.  Pulmonary:     Effort: Pulmonary effort is normal.     Breath sounds: Normal breath sounds.  Chest:     Breasts: Breasts are symmetrical.        Right: Normal. No mass, nipple discharge or skin change.        Left: Normal. No mass, nipple discharge or skin change.  Abdominal:     General: Bowel sounds are normal. There is no distension.  Palpations: Abdomen is soft.     Tenderness: There is no abdominal tenderness.  Genitourinary:    General: Normal vulva.     Vagina: Vaginal discharge present. No erythema or tenderness.     Cervix: Discharge, friability and erythema present. No cervical motion tenderness.     Uterus: Normal.      Adnexa: Right adnexa normal and left adnexa normal.     Rectum: No external hemorrhoid.  Musculoskeletal:        General: No tenderness. Normal range of motion.     Cervical back: Normal range of motion and neck supple.  Lymphadenopathy:     Cervical: No cervical adenopathy.     Upper Body:     Right upper body: No supraclavicular, axillary or pectoral adenopathy.     Left upper body: No supraclavicular, axillary or pectoral adenopathy.  Skin:    General: Skin is warm and dry.  Neurological:     Mental Status: She is alert and oriented to person, place, and time.  Psychiatric:        Mood and Affect: Mood normal.        Thought Content: Thought content normal.        Judgment: Judgment normal.     ASSESSMENT and PLAN: This visit occurred during the SARS-CoV-2 public health emergency.  Safety protocols were in place, including screening questions prior to the visit, additional usage of staff PPE, and extensive cleaning of exam room while observing appropriate contact time as indicated for disinfecting solutions.   Rasheema was seen today for annual exam.  Diagnoses and all orders for this visit:  Encounter for preventative adult  health care exam with abnormal findings -     CBC -     Lipid panel -     Comprehensive metabolic panel -     TSH -     Cytology - PAP( [place])  Mixed hyperlipidemia -     Lipid panel  Vitamin D deficiency -     Vitamin D 1,25 dihydroxy  Encounter for Papanicolaou smear for cervical cancer screening -     Cytology - PAP( [place])  Screen for STD (sexually transmitted disease) -     Cervicovaginal ancillary only( [place]) -     HIV antibody (with reflex)  Trichomoniasis of vagina -     metroNIDAZOLE (FLAGYL) 250 MG tablet; Take 1 tablet (250 mg total) by mouth 3 (three) times daily. -     Urine cytology ancillary only(); Future  Acute vaginitis -     metroNIDAZOLE (FLAGYL) 250 MG tablet; Take 1 tablet (250 mg total) by mouth 3 (three) times daily. -     fluconazole (DIFLUCAN) 150 MG tablet; Take 1 tablet (150 mg total) by mouth daily. Take second tab 3days apart from first tab  Depression, recurrent ([facility])    Trichomoniasis of vagina New Treat with metronidazole x7days Repeat urine cytology in 1week  Mixed hyperlipidemia Advised about the imporatnce if DASH diet and regular exercise. Lipid Panel     Component Value Date/Time   CHOL 218 (H) 08/23/2019 1026   TRIG 160.0 (H) 08/23/2019 1026   HDL 44.70 08/23/2019 1026   CHOLHDL 5 08/23/2019 1026   VLDL 32.0 08/23/2019 1026   LDLCALC 141 (H) 08/23/2019 1026    Depression, recurrent ([facility]) Reports mood has improved. No need for medication or counseling at this time.      Problem List Items Addressed This Visit  Genitourinary   Trichomoniasis of vagina    New Treat with metronidazole x7days Repeat urine cytology in 1week      Relevant Medications   metroNIDAZOLE (FLAGYL) 250 MG tablet   fluconazole (DIFLUCAN) 150 MG tablet   Other Relevant Orders   Urine cytology ancillary only([place])     Other   Depression, recurrent ([place])    Reports mood has improved. No need for  medication or counseling at this time.      Mixed hyperlipidemia    Advised about the imporatnce if DASH diet and regular exercise. Lipid Panel     Component Value Date/Time   CHOL 218 (H) 08/23/2019 1026   TRIG 160.0 (H) 08/23/2019 1026   HDL 44.70 08/23/2019 1026   CHOLHDL 5 08/23/2019 1026   VLDL 32.0 08/23/2019 1026   LDLCALC 141 (H) 08/23/2019 1026        Relevant Orders   Lipid panel (Completed)   Vitamin D deficiency   Relevant Orders   Vitamin D 1,25 dihydroxy    Other Visit Diagnoses    Encounter for preventative adult health care exam with abnormal findings    -  Primary   Relevant Orders   CBC (Completed)   Lipid panel (Completed)   Comprehensive metabolic panel (Completed)   TSH (Completed)   Cytology - PAP( [place]) (Completed)   Encounter for Papanicolaou smear for cervical cancer screening       Relevant Orders   Cytology - PAP( [place]) (Completed)   Screen for STD (sexually transmitted disease)       Relevant Orders   Cervicovaginal ancillary only( [place]) (Completed)   HIV antibody (with reflex) (Completed)   Acute vaginitis       Relevant Medications   metroNIDAZOLE (FLAGYL) 250 MG tablet   fluconazole (DIFLUCAN) 150 MG tablet      Follow up: Return if symptoms worsen or fail to improve.  Wilfred Lacy, NP

## 2019-08-24 ENCOUNTER — Encounter: Payer: Self-pay | Admitting: Nurse Practitioner

## 2019-08-24 LAB — CYTOLOGY - PAP
Comment: NEGATIVE
Diagnosis: NEGATIVE
High risk HPV: NEGATIVE

## 2019-08-24 LAB — CERVICOVAGINAL ANCILLARY ONLY
Bacterial Vaginitis (gardnerella): POSITIVE — AB
Candida Glabrata: NEGATIVE
Candida Vaginitis: POSITIVE — AB
Chlamydia: NEGATIVE
Comment: NEGATIVE
Comment: NEGATIVE
Comment: NEGATIVE
Comment: NEGATIVE
Comment: NEGATIVE
Comment: NORMAL
Neisseria Gonorrhea: NEGATIVE
Trichomonas: POSITIVE — AB

## 2019-08-25 ENCOUNTER — Encounter: Payer: Self-pay | Admitting: Nurse Practitioner

## 2019-08-25 DIAGNOSIS — E782 Mixed hyperlipidemia: Secondary | ICD-10-CM | POA: Insufficient documentation

## 2019-08-25 DIAGNOSIS — A5901 Trichomonal vulvovaginitis: Secondary | ICD-10-CM | POA: Insufficient documentation

## 2019-08-25 MED ORDER — METRONIDAZOLE 250 MG PO TABS: 250.0000 mg | ORAL_TABLET | Freq: Three times a day (TID) | ORAL | 0 refills | Status: DC

## 2019-08-25 MED ORDER — FLUCONAZOLE 150 MG PO TABS: 150.0000 mg | ORAL_TABLET | Freq: Every day | ORAL | 0 refills | Status: DC

## 2019-08-25 NOTE — Assessment & Plan Note (Signed)
Advised about the imporatnce if DASH diet and regular exercise. Lipid Panel     Component Value Date/Time   CHOL 218 (H) 08/23/2019 1026   TRIG 160.0 (H) 08/23/2019 1026   HDL 44.70 08/23/2019 1026   CHOLHDL 5 08/23/2019 1026   VLDL 32.0 08/23/2019 1026   LDLCALC 141 (H) 08/23/2019 1026

## 2019-08-25 NOTE — Assessment & Plan Note (Signed)
Reports mood has improved. No need for medication or counseling at this time.

## 2019-08-25 NOTE — Assessment & Plan Note (Signed)
New Treat with metronidazole x7days Repeat urine cytology in 1week

## 2019-08-26 LAB — VITAMIN D 1,25 DIHYDROXY
Vitamin D 1, 25 (OH)2 Total: 43 pg/mL (ref 18–72)
Vitamin D2 1, 25 (OH)2: 9 pg/mL
Vitamin D3 1, 25 (OH)2: 34 pg/mL

## 2019-08-26 LAB — HIV ANTIBODY (ROUTINE TESTING W REFLEX): HIV 1&2 Ab, 4th Generation: NONREACTIVE

## 2019-12-08 ENCOUNTER — Telehealth: Payer: Self-pay | Admitting: Nurse Practitioner

## 2019-12-08 NOTE — Telephone Encounter (Signed)
Patient dropped off Physical exam and immunization paperwork for Caitlin Foster to fill out.

## 2019-12-09 NOTE — Telephone Encounter (Signed)
Physical Examination forms obtained and we are working on filling those out, will call pt when completed.

## 2019-12-12 NOTE — Telephone Encounter (Signed)
Will be ready tomorrow.

## 2019-12-12 NOTE — Telephone Encounter (Signed)
Pt notified and understood that paperwork will be ready for her up front with her name on it. Pt said she may have someone else pick it up but pt is aware of the fee.

## 2019-12-12 NOTE — Telephone Encounter (Signed)
Patient called to see when form would be ready. I told her you would call when it was ready but she insisted on sending a message asking how long it would take.

## 2020-01-20 DIAGNOSIS — Z20822 Contact with and (suspected) exposure to covid-19: Secondary | ICD-10-CM | POA: Diagnosis not present

## 2020-02-28 ENCOUNTER — Ambulatory Visit: Payer: 59 | Attending: Critical Care Medicine

## 2020-02-28 DIAGNOSIS — Z23 Encounter for immunization: Secondary | ICD-10-CM

## 2020-02-28 NOTE — Progress Notes (Signed)
   Covid-19 Vaccination Clinic  Name:  Caitlin Foster    MRN: [id number] DOB: 1981/04/05  02/28/2020  [name] was observed post Covid-19 immunization for 15 minutes without incident. She was provided with Vaccine Information Sheet and instruction to access the V-Safe system.   [name] was instructed to call 911 with any severe reactions post vaccine: Marland Kitchen Difficulty breathing  . Swelling of face and throat  . A fast heartbeat  . A bad rash all over body  . Dizziness and weakness   Immunizations Administered    Name Date Dose VIS Date Route   Pfizer COVID-19 Vaccine 02/28/2020  1:56 PM 0.3 mL 09/28/2018 Intramuscular   Manufacturer: [employer], [employer]   Lot: [id number]   [place]: [id number]

## 2020-02-29 NOTE — Telephone Encounter (Signed)
Error

## 2020-03-20 ENCOUNTER — Ambulatory Visit: Payer: 59 | Attending: Internal Medicine

## 2020-03-20 DIAGNOSIS — Z23 Encounter for immunization: Secondary | ICD-10-CM

## 2020-03-20 NOTE — Progress Notes (Signed)
   Covid-19 Vaccination Clinic  Name:  YONA KOSEK    MRN: [id number] DOB: Nov 29, 1980  03/20/2020  Ms. Bickhart was observed post Covid-19 immunization for 15 minutes without incident. She was provided with Vaccine Information Sheet and instruction to access the V-Safe system.   Ms. Stooksbury was instructed to call 911 with any severe reactions post vaccine: Marland Kitchen Difficulty breathing  . Swelling of face and throat  . A fast heartbeat  . A bad rash all over body  . Dizziness and weakness   Immunizations Administered    Name Date Dose VIS Date Route   Pfizer COVID-19 Vaccine 03/20/2020 10:36 AM 0.3 mL 09/28/2018 Intramuscular   Manufacturer: [employer], [employer]   Lot: [id number]   [place]: [id number]

## 2020-11-08 DIAGNOSIS — H5213 Myopia, bilateral: Secondary | ICD-10-CM | POA: Diagnosis not present

## 2020-11-26 ENCOUNTER — Encounter: Payer: Self-pay | Admitting: Nurse Practitioner

## 2020-11-26 ENCOUNTER — Other Ambulatory Visit: Payer: Self-pay

## 2020-11-26 ENCOUNTER — Other Ambulatory Visit: Payer: Self-pay | Admitting: Nurse Practitioner

## 2020-11-26 ENCOUNTER — Ambulatory Visit (INDEPENDENT_AMBULATORY_CARE_PROVIDER_SITE_OTHER): Payer: 59 | Admitting: Nurse Practitioner

## 2020-11-26 VITALS — BP 118/70 | HR 86 | Temp 98.4°F | Ht 65.0 in | Wt 215.8 lb

## 2020-11-26 DIAGNOSIS — E782 Mixed hyperlipidemia: Secondary | ICD-10-CM

## 2020-11-26 DIAGNOSIS — F339 Major depressive disorder, recurrent, unspecified: Secondary | ICD-10-CM | POA: Diagnosis not present

## 2020-11-26 DIAGNOSIS — Z0001 Encounter for general adult medical examination with abnormal findings: Secondary | ICD-10-CM | POA: Diagnosis not present

## 2020-11-26 DIAGNOSIS — F17200 Nicotine dependence, unspecified, uncomplicated: Secondary | ICD-10-CM

## 2020-11-26 DIAGNOSIS — Z716 Tobacco abuse counseling: Secondary | ICD-10-CM

## 2020-11-26 DIAGNOSIS — E559 Vitamin D deficiency, unspecified: Secondary | ICD-10-CM | POA: Diagnosis not present

## 2020-11-26 LAB — COMPREHENSIVE METABOLIC PANEL
ALT: 9 U/L (ref 0–35)
AST: 13 U/L (ref 0–37)
Albumin: 4.1 g/dL (ref 3.5–5.2)
Alkaline Phosphatase: 74 U/L (ref 39–117)
BUN: 9 mg/dL (ref 6–23)
CO2: 25 mEq/L (ref 19–32)
Calcium: 9.9 mg/dL (ref 8.4–10.5)
Chloride: 105 mEq/L (ref 96–112)
Creatinine, Ser: 0.82 mg/dL (ref 0.40–1.20)
GFR: 91.1 mL/min (ref >60.00)
Glucose, Bld: 94 mg/dL (ref 70–99)
Potassium: 4 mEq/L (ref 3.5–5.1)
Sodium: 139 mEq/L (ref 135–145)
Total Bilirubin: 0.5 mg/dL (ref 0.2–1.2)
Total Protein: 7.4 g/dL (ref 6.0–8.3)

## 2020-11-26 LAB — LIPID PANEL
Cholesterol: 215 mg/dL — ABNORMAL HIGH (ref 0–200)
HDL: 39.9 mg/dL (ref >39.00)
LDL Cholesterol: 138 mg/dL — ABNORMAL HIGH (ref 0–99)
NonHDL: 175.08
Total CHOL/HDL Ratio: 5
Triglycerides: 187 mg/dL — ABNORMAL HIGH (ref 0.0–149.0)
VLDL: 37.4 mg/dL (ref 0.0–40.0)

## 2020-11-26 LAB — CBC
HCT: 38.2 % (ref 36.0–46.0)
Hemoglobin: 12.7 g/dL (ref 12.0–15.0)
MCHC: 33.4 g/dL (ref 30.0–36.0)
MCV: 86.5 fl (ref 78.0–100.0)
Platelets: 339 10*3/uL (ref 150.0–400.0)
RBC: 4.41 Mil/uL (ref 3.87–5.11)
RDW: 13.4 % (ref 11.5–15.5)
WBC: 7.4 10*3/uL (ref 4.0–10.5)

## 2020-11-26 LAB — TSH: TSH: 2.05 u[IU]/mL (ref 0.35–4.50)

## 2020-11-26 LAB — POCT URINE PREGNANCY: Preg Test, Ur: NEGATIVE

## 2020-11-26 MED ORDER — CHANTIX STARTING MONTH PAK 0.5 MG X 11 & 1 MG X 42 PO TABS: ORAL_TABLET | ORAL | 0 refills | Status: DC

## 2020-11-26 NOTE — Assessment & Plan Note (Signed)
Waxing and waning Agreed to psychology referral

## 2020-11-26 NOTE — Telephone Encounter (Signed)
Per pharmacy, chantix is on backorder. I do not know when it will be available. Is she will to change medication to Zyban (Bupropion)?

## 2020-11-26 NOTE — Assessment & Plan Note (Signed)
Repeat lipid panel ?

## 2020-11-26 NOTE — Patient Instructions (Addendum)
Go to lab for blood draw and urine collection Start heart healthy diet and daily exercise Start chantix as discussed F/up in 66month after staring chantix  Varenicline oral tablets What is this medicine? VARENICLINE (var e NI kleen) is used to help people quit smoking. It is used with a patient support program recommended by your physician. This medicine may be used for other purposes; ask your health care provider or pharmacist if you have questions. COMMON BRAND NAME(S): Chantix What should I tell my health care provider before I take this medicine? They need to know if you have any of these conditions:  heart disease  if you often drink alcohol  kidney disease  mental illness  on hemodialysis  seizures  history of stroke  suicidal thoughts, plans, or attempt; a previous suicide attempt by you or a family member  an unusual or allergic reaction to varenicline, other medicines, foods, dyes, or preservatives  pregnant or trying to get pregnant  breast-feeding How should I use this medicine? Take this medicine by mouth after eating. Take with a full glass of water. Follow the directions on the prescription label. Take your doses at regular intervals. Do not take your medicine more often than directed. There are 3 ways you can use this medicine to help you quit smoking; talk to your health care professional to decide which plan is right for you: 1) you can choose a quit date and start this medicine 1 week before the quit date, or, 2) you can start taking this medicine before you choose a quit date, and then pick a quit date between day 8 and 35 days of treatment, or, 3) if you are not sure that you are able or willing to quit smoking right away, start taking this medicine and slowly decrease the amount you smoke as directed by your health care professional with the goal of being cigarette-free by week 12 of treatment. Stick to your plan; ask about support groups or other ways to  help you remain cigarette-free. If you are motivated to quit smoking and did not succeed during a previous attempt with this medicine for reasons other than side effects, or if you returned to smoking after this treatment, speak with your health care professional about whether another course of this medicine may be right for you. A special MedGuide will be given to you by the pharmacist with each prescription and refill. Be sure to read this information carefully each time. Talk to your pediatrician regarding the use of this medicine in children. This medicine is not approved for use in children. Overdosage: If you think you have taken too much of this medicine contact a poison control center or emergency room at once. NOTE: This medicine is only for you. Do not share this medicine with others. What if I miss a dose? If you miss a dose, take it as soon as you can. If it is almost time for your next dose, take only that dose. Do not take double or extra doses. What may interact with this medicine?  alcohol  insulin  other medicines used to help people quit smoking  theophylline  warfarin This list may not describe all possible interactions. Give your health care provider a list of all the medicines, herbs, non-prescription drugs, or dietary supplements you use. Also tell them if you smoke, drink alcohol, or use illegal drugs. Some items may interact with your medicine. What should I watch for while using this medicine? It is okay if  you do not succeed at your attempt to quit and have a cigarette. You can still continue your quit attempt and keep using this medicine as directed. Just throw away your cigarettes and get back to your quit plan. Talk to your health care provider before using other treatments to quit smoking. Using this medicine with other treatments to quit smoking may increase the risk for side effects compared to using a treatment alone. You may get drowsy or dizzy. Do not drive,  use machinery, or do anything that needs mental alertness until you know how this medicine affects you. Do not stand or sit up quickly, especially if you are an older patient. This reduces the risk of dizzy or fainting spells. Decrease the number of alcoholic beverages that you drink during treatment with this medicine until you know if this medicine affects your ability to tolerate alcohol. Some people have experienced increased drunkenness (intoxication), unusual or sometimes aggressive behavior, or no memory of things that have happened (amnesia) during treatment with this medicine. Sleepwalking can happen during treatment with this medicine, and can sometimes lead to behavior that is harmful to you, other people, or property. Stop taking this medicine and tell your doctor if you start sleepwalking or have other unusual sleep-related activity. After taking this medicine, you may get up out of bed and do an activity that you do not know you are doing. The next morning, you may have no memory of this. Activities include driving a car ("sleep-driving"), making and eating food, talking on the phone, sexual activity, and sleep-walking. Serious injuries have occurred. Stop the medicine and call your doctor right away if you find out you have done any of these activities. Do not take this medicine if you have used alcohol that evening. Do not take it if you have taken another medicine for sleep. The risk of doing these sleep-related activities is higher. Patients and their families should watch out for new or worsening depression or thoughts of suicide. Also watch out for sudden changes in feelings such as feeling anxious, agitated, panicky, irritable, hostile, aggressive, impulsive, severely restless, overly excited and hyperactive, or not being able to sleep. If this happens, call your health care professional. If you have diabetes and you quit smoking, the effects of insulin may be increased and you may need to  reduce your insulin dose. Check with your doctor or health care professional about how you should adjust your insulin dose. What side effects may I notice from receiving this medicine? Side effects that you should report to your doctor or health care professional as soon as possible:  allergic reactions like skin rash, itching or hives, swelling of the face, lips, tongue, or throat  acting aggressive, being angry or violent, or acting on dangerous impulses  breathing problems  changes in emotions or moods  chest pain or chest tightness  feeling faint or lightheaded, falls  hallucination, loss of contact with reality  mouth sores  redness, blistering, peeling or loosening of the skin, including inside the mouth  signs and symptoms of a stroke like changes in vision; confusion; trouble speaking or understanding; severe headaches; sudden numbness or weakness of the face, arm or leg; trouble walking; dizziness; loss of balance or coordination  seizures  sleepwalking  suicidal thoughts or other mood changes Side effects that usually do not require medical attention (report to your doctor or health care professional if they continue or are bothersome):  constipation  gas  headache  nausea, vomiting  strange dreams  trouble sleeping This list may not describe all possible side effects. Call your doctor for medical advice about side effects. You may report side effects to FDA at 1-800-FDA-1088. Where should I keep my medicine? Keep out of the reach of children. Store at room temperature between 15 and 30 degrees C (59 and 86 degrees F). Throw away any unused medicine after the expiration date. NOTE: This sheet is a summary. It may not cover all possible information. If you have questions about this medicine, talk to your doctor, pharmacist, or health care provider.  2021 Elsevier/Gold Standard (2018-07-09 14:27:36)

## 2020-11-26 NOTE — Assessment & Plan Note (Signed)
Smokes 1/2ppd x 37yrs, has quit in last for about 56months with use if patch but resumed due to increase anxiety. She is ready to quit, but has not set a quit date. She wants to try chantix. Advised about possible medications side effects and how to start medication. F/up 41month after starting medication

## 2020-11-26 NOTE — Progress Notes (Signed)
Subjective:    Patient ID: Caitlin Foster, female    DOB: 1981/07/17, 39 y.o.   MRN: [id number]  Patient presents today for CPE and eval of chronic conditions  HPI Tobacco use disorder Smokes 1/2ppd x 75yrs, has quit in last for about 72months with use if patch but resumed due to increase anxiety. She is ready to quit, but has not set a quit date. She wants to try chantix. Advised about possible medications side effects and how to start medication. F/up 81month after starting medication  Vitamin D deficiency Repeat vit. D  Mixed hyperlipidemia Repeat lipid panel  Depression Waxing and waning Agreed to psychology referral  Sexual History (orientation,birth control, marital status, STD):sexually active, denies need for STD screen today, up to date with PAP, breast exam needed today  Depression/Suicide: Depression screen [facility] 2/9 11/26/2020 08/23/2019 09/24/2017 08/24/2017 07/08/2012  Decreased Interest 0 0 2 1 0  Down, Depressed, Hopeless 1 0 2 1 0  PHQ - 2 Score 1 0 4 2 0  Altered sleeping 1 - 2 2 -  Tired, decreased energy 1 - 1 2 -  Change in appetite 1 - 2 2 -  Feeling bad or failure about yourself  0 - 1 2 -  Trouble concentrating 0 - 2 2 -  Moving slowly or fidgety/restless 0 - 1 1 -  Suicidal thoughts 0 - 0 0 -  PHQ-9 Score 4 - 13 13 -  Difficult doing work/chores Somewhat difficult - - - -   GAD 7 : Generalized Anxiety Score 11/26/2020 09/24/2017 08/24/2017  Nervous, Anxious, on Edge 1 3 2   Control/stop worrying 1 2 3   Worry too much - different things 1 2 3   Trouble relaxing 1 2 3   Restless 0 2 2  Easily annoyed or irritable 1 2 2   Afraid - awful might happen 1 2 3   Total GAD 7 Score 6 15 18   Anxiety Difficulty Somewhat difficult - Somewhat difficult   Vision:up to date  Dental:up to date  Immunizations: (TDAP, Hep C screen, Pneumovax, Influenza, zoster)  Health Maintenance  Topic Date Due  .  Hepatitis C: One time screening is recommended by Center for  Disease Control  (CDC) for  adults born from [birth year] through 1963.   Never done  . Flu Shot  03/04/2021  . Pap Smear  08/22/2022  . Tetanus Vaccine  06/04/2024  . COVID-19 Vaccine  Completed  . HIV Screening  Completed  . HPV Vaccine  Aged Out   Diet:regular Weight:  Wt Readings from Last 3 Encounters:  11/26/20 215 lb 12.8 oz (97.9 kg)  08/23/19 205 lb (93 kg)  12/21/17 204 lb (92.5 kg)   Fall Risk: Fall Risk  11/26/2020 08/23/2019 08/24/2017 07/08/2012  Falls in the past year? 0 0 No No  Number falls in past yr: 0 - - -  Injury with Fall? 0 - - -  Risk for fall due to : No Fall Risks - - -  Follow up Falls evaluation completed - - -   Medications and allergies reviewed with patient and updated if appropriate.  Patient Active Problem List   Diagnosis Date Noted  . Mixed hyperlipidemia 08/25/2019  . Trichomoniasis of vagina 08/25/2019  . Tobacco use disorder 11/30/2017  . Encounter for tobacco use cessation counseling 11/30/2017  . Chronic back pain 09/24/2017  . Headache 09/24/2017  . Vitamin D deficiency 09/24/2017  . Depression 09/24/2017  . Hair loss 07/09/2012    Current Outpatient  Medications on File Prior to Visit  Medication Sig Dispense Refill  . acetaminophen (TYLENOL) 325 MG tablet Take 650 mg by mouth every 6 (six) hours as needed.    Marland Kitchen ELDERBERRY PO Take by mouth.    . Vitamin D, Ergocalciferol, (DRISDOL) 50000 units CAPS capsule Take 1 capsule (50,000 Units total) by mouth every 7 (seven) days. 6 capsule 0   No current facility-administered medications on file prior to visit.    Past Medical History:  Diagnosis Date  . Scoliosis     Past Surgical History:  Procedure Laterality Date  . BACK SURGERY     Harrington Rods secondary to scoliosis  . scoli      Social History   Socioeconomic History  . Marital status: Single    Spouse name: Not on file  . Number of children: 2  . Years of education: Not on file  . Highest education level: Not on file   Occupational History  . Occupation: LPN  Tobacco Use  . Smoking status: Current Every Day Smoker    Packs/day: 0.50    Years: 10.00    Pack years: 5.00    Types: E-cigarettes    Last attempt to quit: 12/07/2011    Years since quitting: 8.9  . Smokeless tobacco: Never Used  . Tobacco comment: spoke newports, menthol  Vaping Use  . Vaping Use: Never used  Substance and Sexual Activity  . Alcohol use: Yes    Comment: social  . Drug use: No  . Sexual activity: Yes    Birth control/protection: None  Other Topics Concern  . Not on file  Social History Narrative  . Not on file   Social Determinants of Health   Financial Resource Strain: Not on file  Food Insecurity: Not on file  Transportation Needs: Not on file  Physical Activity: Not on file  Stress: Not on file  Social Connections: Not on file   Family History  Problem Relation Age of Onset  . Diabetes Mother   . Depression Mother   . Heart attack Paternal Aunt   . Diabetes Maternal Grandmother   . Depression Maternal Aunt        Review of Systems  Constitutional: Negative for fever, malaise/fatigue and weight loss.  HENT: Negative for congestion and sore throat.   Eyes:       Negative for visual changes  Respiratory: Negative for cough and shortness of breath.   Cardiovascular: Negative for chest pain, palpitations and leg swelling.  Gastrointestinal: Negative for blood in stool, constipation, diarrhea and heartburn.  Genitourinary: Negative for dysuria, frequency and urgency.  Musculoskeletal: Negative for falls, joint pain and myalgias.  Skin: Negative for rash.  Neurological: Negative for dizziness, sensory change and headaches.  Endo/Heme/Allergies: Does not bruise/bleed easily.  Psychiatric/Behavioral: Positive for depression. Negative for hallucinations, substance abuse and suicidal ideas. The patient is nervous/anxious. The patient does not have insomnia.     Objective:   Vitals:   11/26/20 1008   BP: 118/70  Pulse: 86  Temp: 98.4 F (36.9 C)  SpO2: 98%    Body mass index is 35.91 kg/m.   Physical Examination:  Physical Exam Vitals reviewed. Exam conducted with a chaperone present.  Constitutional:      General: She is not in acute distress.    Appearance: She is well-developed. She is obese.  HENT:     Right Ear: Tympanic membrane, ear canal and external ear normal.     Left Ear: Tympanic membrane, ear  canal and external ear normal.     Nose: Nose normal.     Mouth/Throat:     Pharynx: No oropharyngeal exudate.  Eyes:     Extraocular Movements: Extraocular movements intact.     Conjunctiva/sclera: Conjunctivae normal.  Cardiovascular:     Rate and Rhythm: Normal rate and regular rhythm.     Pulses: Normal pulses.     Heart sounds: Normal heart sounds.  Pulmonary:     Effort: Pulmonary effort is normal. No respiratory distress.     Breath sounds: Normal breath sounds.  Chest:     Chest wall: No tenderness.  Breasts:     Right: Normal. No axillary adenopathy or supraclavicular adenopathy.     Left: Normal. No axillary adenopathy or supraclavicular adenopathy.    Abdominal:     General: Bowel sounds are normal.     Palpations: Abdomen is soft.  Genitourinary:    Comments: Deferred to next year Musculoskeletal:        General: Normal range of motion.     Cervical back: Normal range of motion and neck supple.  Lymphadenopathy:     Upper Body:     Right upper body: No supraclavicular, axillary or pectoral adenopathy.     Left upper body: No supraclavicular, axillary or pectoral adenopathy.  Skin:    General: Skin is warm and dry.  Neurological:     Mental Status: She is alert and oriented to person, place, and time.     Deep Tendon Reflexes: Reflexes are normal and symmetric.  Psychiatric:        Mood and Affect: Mood normal.        Behavior: Behavior normal.        Thought Content: Thought content normal.    ASSESSMENT and PLAN: This visit  occurred during the SARS-CoV-2 public health emergency.  Safety protocols were in place, including screening questions prior to the visit, additional usage of staff PPE, and extensive cleaning of exam room while observing appropriate contact time as indicated for disinfecting solutions.   Neala was seen today for annual exam.  Diagnoses and all orders for this visit:  Encounter for preventative adult health care exam with abnormal findings -     CBC -     Comprehensive metabolic panel -     TSH  Mixed hyperlipidemia -     Lipid panel  Tobacco use disorder -     varenicline (CHANTIX STARTING MONTH PAK) 0.5 MG X 11 & 1 MG X 42 tablet; Take one 0.5 mg tablet by mouth once daily for 3 days, then increase to one 0.5 mg tablet twice daily for 4 days, then increase to one 1 mg tablet twice daily. -     POCT urine pregnancy  Encounter for tobacco use cessation counseling -     varenicline (CHANTIX STARTING MONTH PAK) 0.5 MG X 11 & 1 MG X 42 tablet; Take one 0.5 mg tablet by mouth once daily for 3 days, then increase to one 0.5 mg tablet twice daily for 4 days, then increase to one 1 mg tablet twice daily. -     POCT urine pregnancy  Depression, recurrent ([place]) -     Ambulatory referral to Psychology  Vitamin D deficiency -     Vitamin D 1,25 dihydroxy      Problem List Items Addressed This Visit      Other   Depression    Waxing and waning Agreed to psychology referral  Encounter for tobacco use cessation counseling   Relevant Medications   varenicline (CHANTIX STARTING MONTH PAK) 0.5 MG X 11 & 1 MG X 42 tablet   Other Relevant Orders   POCT urine pregnancy (Completed)   Mixed hyperlipidemia    Repeat lipid panel      Relevant Orders   Lipid panel   Tobacco use disorder    Smokes 1/2ppd x 74yrs, has quit in last for about 48months with use if patch but resumed due to increase anxiety. She is ready to quit, but has not set a quit date. She wants to try  chantix. Advised about possible medications side effects and how to start medication. F/up 67month after starting medication      Relevant Medications   varenicline (CHANTIX STARTING MONTH PAK) 0.5 MG X 11 & 1 MG X 42 tablet   Other Relevant Orders   POCT urine pregnancy (Completed)   Vitamin D deficiency    Repeat vit. D      Relevant Orders   Vitamin D 1,25 dihydroxy    Other Visit Diagnoses    Encounter for preventative adult health care exam with abnormal findings    -  Primary   Relevant Orders   CBC   Comprehensive metabolic panel   TSH      Follow up: Return in about 4 weeks (around 12/24/2020) for tobacco cessation.  Wilfred Lacy, NP

## 2020-11-26 NOTE — Assessment & Plan Note (Signed)
Repeat vit. D °

## 2020-11-30 LAB — VITAMIN D 1,25 DIHYDROXY
Vitamin D 1, 25 (OH)2 Total: 40 pg/mL (ref 18–72)
Vitamin D2 1, 25 (OH)2: <8 pg/mL
Vitamin D3 1, 25 (OH)2: 40 pg/mL

## 2020-12-26 ENCOUNTER — Ambulatory Visit: Payer: 59 | Admitting: Nurse Practitioner

## 2021-06-10 ENCOUNTER — Encounter: Payer: Self-pay | Admitting: Nurse Practitioner

## 2021-06-10 ENCOUNTER — Other Ambulatory Visit: Payer: Self-pay

## 2021-06-10 ENCOUNTER — Ambulatory Visit: Payer: 59 | Admitting: Nurse Practitioner

## 2021-06-10 VITALS — BP 118/86 | HR 79 | Temp 97.2°F | Wt 222.8 lb

## 2021-06-10 DIAGNOSIS — E66812 Obesity, class 2: Secondary | ICD-10-CM | POA: Insufficient documentation

## 2021-06-10 DIAGNOSIS — R42 Dizziness and giddiness: Secondary | ICD-10-CM

## 2021-06-10 DIAGNOSIS — Z6836 Body mass index (BMI) 36.0-36.9, adult: Secondary | ICD-10-CM | POA: Insufficient documentation

## 2021-06-10 DIAGNOSIS — Z6837 Body mass index (BMI) 37.0-37.9, adult: Secondary | ICD-10-CM | POA: Diagnosis not present

## 2021-06-10 DIAGNOSIS — R030 Elevated blood-pressure reading, without diagnosis of hypertension: Secondary | ICD-10-CM | POA: Diagnosis not present

## 2021-06-10 DIAGNOSIS — E669 Obesity, unspecified: Secondary | ICD-10-CM | POA: Insufficient documentation

## 2021-06-10 MED ORDER — VITAMIN D3 25 MCG (1000 UT) PO CAPS: 1000.0000 [IU] | ORAL_CAPSULE | Freq: Every day | ORAL | Status: DC

## 2021-06-10 MED ORDER — MULTI-VITAMIN/MINERALS PO TABS: 1.0000 | ORAL_TABLET | Freq: Every day | ORAL | Status: AC

## 2021-06-10 NOTE — Patient Instructions (Signed)
Maintain adequate oral hydration. Aim for 60oz oer day. Maintain heart healthy diet, adequate sleep and daily exercise. Continue to monitor BP 3x/week in AM Call office if BP  persistently >140/90.  How to Increase Your Level of Physical Activity Getting regular physical activity is important for your overall health and well-being. Most people do not get enough exercise. There are easy ways to increase your level of physical activity, even if you have not been very active in the past or if you are just starting out. What are the benefits of physical activity? Physical activity has many short-term and long-term benefits. Being active on a regular basis can improve your physical and mental health as well as provide other benefits. Physical health benefits Helping you lose weight or maintain a healthy weight. Strengthening your muscles and bones. Reducing your risk of certain long-term (chronic) diseases, including heart disease, cancer, and diabetes. Being able to move around more easily and for longer periods of time without getting tired (increased endurance or stamina). Improving your ability to fight off illness (enhanced immunity). Being able to sleep better. Helping you stay healthy as you get older, including: Helping you stay mobile, or capable of walking and moving around. Preventing accidents, such as falls. Increasing life expectancy. Mental health benefits Boosting your mood and improving your self-esteem. Lowering your chance of having mental health problems, such as depression or anxiety. Helping you feel good about your body. Other benefits Finding new sources of fun and enjoyment. Meeting new people who share a common interest. Before you begin If you have a chronic illness or have not been active for a while, check with your health care provider about how to get started. Ask your health care provider what activities are safe for you. Start out slowly. Walking or doing some  simple chair exercises is a good place to start, especially if you have not been active before or for a long time. Set goals that you can work toward. Ask your health care provider how much exercise is best for you. In general, most adults should: Do moderate-intensity exercise for at least 150 minutes each week (30 minutes on most days of the week) or vigorous exercise for at least 75 minutes each week, or a combination of these. Moderate-intensity exercise can include walking at a quick pace, biking, yoga, water aerobics, or gardening. Vigorous exercise involves activities that take more effort, such as jogging or running, playing sports, swimming laps, or jumping rope. Do strength exercises on at least 2 days each week. This can include weight lifting, body weight exercises, and resistance-band exercises. How to be more physically active Make a plan  Try to find activities that you enjoy. You are more likely to commit to an exercise routine if it does not feel like a chore. If you have bone or joint problems, choose low-impact exercises, like walking or swimming. Use these tips for being successful with an exercise plan: Find a workout partner for accountability. Join a group or class, such as an aerobics class, cycling class, or sports team. Make family time active. Go for a walk, bike, or swim. Include a variety of exercises each week. Consider using a fitness tracker, such as a mobile phone app or a device worn like a watch, that will count the number of steps you take each day. Many people strive to reach 10,000 steps a day. Find ways to be active in your daily routines Besides your formal exercise plans, you can find ways to  do physical activity during your daily routines, such as: Walking or biking to work or to the store. Taking the stairs instead of the elevator. Parking farther away from the door at work or at the store. Planning walking meetings. Walking around while you are on  the phone. Where to find more information Centers for Disease Control and Prevention: WorkDashboard.es President's Council on Fitness, Sports & Nutrition: www.fitness.gov ChooseMyPlate: MassVoice.es Contact a health care provider if: You have headaches, muscle aches, or joint pain that is concerning. You feel dizzy or light-headed while exercising. You faint. You feel your heart skipping, racing, or fluttering. You have chest pain while exercising. Summary Exercise benefits your mind and body at any age, even if you are just starting out. If you have a chronic illness or have not been active for a while, check with your health care provider before increasing your physical activity. Choose activities that are safe and enjoyable for you. Ask your health care provider what activities are safe for you. Start slowly. Tell your health care provider if you have problems as you start to increase your activity level. This information is not intended to replace advice given to you by your health care provider. Make sure you discuss any questions you have with your health care provider. Document Revised: 11/16/2020 Document Reviewed: 11/16/2020 Elsevier Patient Education  [place] for [employer] Calories are units of energy. Your body needs a certain number of calories from food to keep going throughout the day. When you eat or drink more calories than your body needs, your body stores the extra calories mostly as fat. When you eat or drink fewer calories than your body needs, your body burns fat to get the energy it needs. Calorie counting means keeping track of how many calories you eat and drink each day. Calorie counting can be helpful if you need to lose weight. If you eat fewer calories than your body needs, you should lose weight. Ask your health care provider what a healthy weight is for you. For calorie counting to work, you will need to eat the right  number of calories each day to lose a healthy amount of weight per week. A dietitian can help you figure out how many calories you need in a day and will suggest ways to reach your calorie goal. A healthy amount of weight to lose each week is usually 1-2 lb (0.5-0.9 kg). This usually means that your daily calorie intake should be reduced by 500-750 calories. Eating 1,200-1,500 calories a day can help most women lose weight. Eating 1,500-1,800 calories a day can help most men lose weight. What do I need to know about calorie counting? Work with your health care provider or dietitian to determine how many calories you should get each day. To meet your daily calorie goal, you will need to: Find out how many calories are in each food that you would like to eat. Try to do this before you eat. Decide how much of the food you plan to eat. Keep a food log. Do this by writing down what you ate and how many calories it had. To successfully lose weight, it is important to balance calorie counting with a healthy lifestyle that includes regular activity. Where do I find calorie information? The number of calories in a food can be found on a Nutrition Facts label. If a food does not have a Nutrition Facts label, try to look up the calories online or  ask your dietitian for help. Remember that calories are listed per serving. If you choose to have more than one serving of a food, you will have to multiply the calories per serving by the number of servings you plan to eat. For example, the label on a package of bread might say that a serving size is 1 slice and that there are 90 calories in a serving. If you eat 1 slice, you will have eaten 90 calories. If you eat 2 slices, you will have eaten 180 calories. How do I keep a food log? After each time that you eat, record the following in your food log as soon as possible: What you ate. Be sure to include toppings, sauces, and other extras on the food. How much you ate.  This can be measured in cups, ounces, or number of items. How many calories were in each food and drink. The total number of calories in the food you ate. Keep your food log near you, such as in a pocket-sized notebook or on an app or website on your mobile phone. Some programs will calculate calories for you and show you how many calories you have left to meet your daily goal. What are some portion-control tips? Know how many calories are in a serving. This will help you know how many servings you can have of a certain food. Use a measuring cup to measure serving sizes. You could also try weighing out portions on a kitchen scale. With time, you will be able to estimate serving sizes for some foods. Take time to put servings of different foods on your favorite plates or in your favorite bowls and cups so you know what a serving looks like. Try not to eat straight from a food's packaging, such as from a bag or box. Eating straight from the package makes it hard to see how much you are eating and can lead to overeating. Put the amount you would like to eat in a cup or on a plate to make sure you are eating the right portion. Use smaller plates, glasses, and bowls for smaller portions and to prevent overeating. Try not to multitask. For example, avoid watching TV or using your computer while eating. If it is time to eat, sit down at a table and enjoy your food. This will help you recognize when you are full. It will also help you be more mindful of what and how much you are eating. What are tips for following this plan? Reading food labels Check the calorie count compared with the serving size. The serving size may be smaller than what you are used to eating. Check the source of the calories. Try to choose foods that are high in protein, fiber, and vitamins, and low in saturated fat, trans fat, and sodium. Shopping Read nutrition labels while you shop. This will help you make healthy decisions about  which foods to buy. Pay attention to nutrition labels for low-fat or fat-free foods. These foods sometimes have the same number of calories or more calories than the full-fat versions. They also often have added sugar, starch, or salt to make up for flavor that was removed with the fat. Make a grocery list of lower-calorie foods and stick to it. Cooking Try to cook your favorite foods in a healthier way. For example, try baking instead of frying. Use low-fat dairy products. Meal planning Use more fruits and vegetables. One-half of your plate should be fruits and vegetables. Include lean proteins,  such as chicken, [place], and fish. Lifestyle Each week, aim to do one of the following: 150 minutes of moderate exercise, such as walking. 75 minutes of vigorous exercise, such as running. General information Know how many calories are in the foods you eat most often. This will help you calculate calorie counts faster. Find a way of tracking calories that works for you. Get creative. Try different apps or programs if writing down calories does not work for you. What foods should I eat?  Eat nutritious foods. It is better to have a nutritious, high-calorie food, such as an avocado, than a food with few nutrients, such as a bag of potato chips. Use your calories on foods and drinks that will fill you up and will not leave you hungry soon after eating. Examples of foods that fill you up are nuts and nut butters, vegetables, lean proteins, and high-fiber foods such as whole grains. High-fiber foods are foods with more than 5 g of fiber per serving. Pay attention to calories in drinks. Low-calorie drinks include water and unsweetened drinks. The items listed above may not be a complete list of foods and beverages you can eat. Contact a dietitian for more information. What foods should I limit? Limit foods or drinks that are not good sources of vitamins, minerals, or protein or that are high in unhealthy  fats. These include: Candy. Other sweets. Sodas, specialty coffee drinks, alcohol, and juice. The items listed above may not be a complete list of foods and beverages you should avoid. Contact a dietitian for more information. How do I count calories when eating out? Pay attention to portions. Often, portions are much larger when eating out. Try these tips to keep portions smaller: Consider sharing a meal instead of getting your own. If you get your own meal, eat only half of it. Before you start eating, ask for a container and put half of your meal into it. When available, consider ordering smaller portions from the menu instead of full portions. Pay attention to your food and drink choices. Knowing the way food is cooked and what is included with the meal can help you eat fewer calories. If calories are listed on the menu, choose the lower-calorie options. Choose dishes that include vegetables, fruits, whole grains, low-fat dairy products, and lean proteins. Choose items that are boiled, broiled, grilled, or steamed. Avoid items that are buttered, battered, fried, or served with cream sauce. Items labeled as crispy are usually fried, unless stated otherwise. Choose water, low-fat milk, unsweetened iced tea, or other drinks without added sugar. If you want an alcoholic beverage, choose a lower-calorie option, such as a glass of wine or light beer. Ask for dressings, sauces, and syrups on the side. These are usually high in calories, so you should limit the amount you eat. If you want a salad, choose a garden salad and ask for grilled meats. Avoid extra toppings such as bacon, cheese, or fried items. Ask for the dressing on the side, or ask for olive oil and vinegar or lemon to use as dressing. Estimate how many servings of a food you are given. Knowing serving sizes will help you be aware of how much food you are eating at restaurants. Where to find more information Centers for Disease Control  and Prevention: http://www.wolf.info/ U.S. Department of Agriculture: http://www.wilson-mendoza.org/ Summary Calorie counting means keeping track of how many calories you eat and drink each day. If you eat fewer calories than your body needs, you should lose  weight. A healthy amount of weight to lose per week is usually 1-2 lb (0.5-0.9 kg). This usually means reducing your daily calorie intake by 500-750 calories. The number of calories in a food can be found on a Nutrition Facts label. If a food does not have a Nutrition Facts label, try to look up the calories online or ask your dietitian for help. Use smaller plates, glasses, and bowls for smaller portions and to prevent overeating. Use your calories on foods and drinks that will fill you up and not leave you hungry shortly after a meal. This information is not intended to replace advice given to you by your health care provider. Make sure you discuss any questions you have with your health care provider. Document Revised: 09/01/2019 Document Reviewed: 09/01/2019 Elsevier Patient Education  2022 [employer].

## 2021-06-10 NOTE — Progress Notes (Signed)
Subjective:  Patient ID: Caitlin Foster, female    DOB: 12/04/1980  Age: 40 y.o. MRN: [id number]  CC: Acute Visit (Pt c/o headaches, dizziness and elevated BP x 4 days. Pt states she has been checking BP all weekend and the bottom number is staying in the 90s.)  Dizziness This is a new problem. The current episode started in the past 7 days. The problem occurs intermittently. The problem has been resolved. Pertinent negatives include no abdominal pain, anorexia, arthralgias, change in bowel habit, chest pain, chills, congestion, coughing, diaphoresis, fatigue, fever, headaches, joint swelling, myalgias, nausea, neck pain, numbness, rash, sore throat, swollen glands, urinary symptoms, vertigo, visual change, vomiting or weakness. The symptoms are aggravated by walking. She has tried rest for the symptoms.  Hydration: 2-16oz of water, 3-20oz Dr. Malachi Bonds soda Sleep: about 4hrs. Admits to using her phone at bedtime which interferes with quality of sleep. Exercise: none Diet: high calorie diet Continuous tobacco use.  BP Readings from Last 3 Encounters:  06/10/21 118/86  11/26/20 118/70  08/23/19 112/82    Wt Readings from Last 3 Encounters:  06/10/21 222 lb 12.8 oz (101.1 kg)  11/26/20 215 lb 12.8 oz (97.9 kg)  08/23/19 205 lb (93 kg)    Reviewed past Medical, Social and Family history today.  Outpatient Medications Prior to Visit  Medication Sig Dispense Refill   acetaminophen (TYLENOL) 325 MG tablet Take 650 mg by mouth every 6 (six) hours as needed.     ELDERBERRY PO Take by mouth.     Vitamin D, Ergocalciferol, (DRISDOL) 50000 units CAPS capsule Take 1 capsule (50,000 Units total) by mouth every 7 (seven) days. 6 capsule 0   varenicline (CHANTIX STARTING MONTH PAK) 0.5 MG X 11 & 1 MG X 42 tablet Take one 0.5 mg tablet by mouth once daily for 3 days, then increase to one 0.5 mg tablet twice daily for 4 days, then increase to one 1 mg tablet twice daily. 53 tablet 0   No  facility-administered medications prior to visit.   ROS See HPI  Objective:  BP 118/86 (BP Location: Left Arm, Patient Position: Sitting, Cuff Size: Normal)   Pulse 79   Temp (!) 97.2 F (36.2 C) (Temporal)   Wt 222 lb 12.8 oz (101.1 kg)   LMP 06/06/2021 (Exact Date)   SpO2 98%   BMI 37.08 kg/m   Physical Exam Constitutional:      Appearance: She is obese.  Cardiovascular:     Rate and Rhythm: Normal rate and regular rhythm.     Pulses: Normal pulses.     Heart sounds: Normal heart sounds.  Pulmonary:     Effort: Pulmonary effort is normal.     Breath sounds: Normal breath sounds.  Musculoskeletal:     Right lower leg: No edema.     Left lower leg: No edema.  Neurological:     Mental Status: She is alert and oriented to person, place, and time.  Psychiatric:        Mood and Affect: Mood normal.        Behavior: Behavior normal.        Thought Content: Thought content normal.   Assessment & Plan:  This visit occurred during the SARS-CoV-2 public health emergency.  Safety protocols were in place, including screening questions prior to the visit, additional usage of staff PPE, and extensive cleaning of exam room while observing appropriate contact time as indicated for disinfecting solutions.   [name] was seen today  for acute visit.  Diagnoses and all orders for this visit:  Dizziness  Elevated BP without diagnosis of hypertension  Class 2 severe obesity due to excess calories with serious comorbidity and body mass index (BMI) of 37.0 to 37.9 in adult [facility])  Other orders -     Cholecalciferol (VITAMIN D3) 25 MCG (1000 UT) CAPS; Take 1 capsule (1,000 Units total) by mouth daily. -     Multiple Vitamins-Minerals (MULTIVITAMIN WITH MINERALS) tablet; Take 1 tablet by mouth daily.  We talked the correlation between tobacco use, poor hydration, high sodium nutrition, poor sleep, sedentary lifestyle; with current symptoms. Discussed the possible complications if the  necessary lifestyle modifications do not occur. Provided printed information on necessary dietary and exercise modifications needed. She verbalized understanding and agreed to f/up in 63months or sooner if symptoms do not improve. Maintain adequate oral hydration. Aim for 60oz oer day. Maintain heart healthy diet, adequate sleep and daily exercise. Continue to monitor BP 3x/week in AM Call office if BP  persistently >140/90.  Problem List Items Addressed This Visit       Other   Class 2 severe obesity due to excess calories with serious comorbidity and body mass index (BMI) of 37.0 to 37.9 in adult [facility]   Other Visit Diagnoses     Dizziness    -  Primary   Elevated BP without diagnosis of hypertension           I have spent 65mins with this patient regarding history taking, documentation, review of labs, formulating plan and discussing treatment options with patient.   Follow-up: Return in about 3 months (around 09/10/2021) for weight management and elevated BP.  Wilfred Lacy, NP

## 2021-10-25 DIAGNOSIS — N920 Excessive and frequent menstruation with regular cycle: Secondary | ICD-10-CM | POA: Diagnosis not present

## 2021-10-25 DIAGNOSIS — N946 Dysmenorrhea, unspecified: Secondary | ICD-10-CM | POA: Diagnosis not present

## 2021-10-25 DIAGNOSIS — N898 Other specified noninflammatory disorders of vagina: Secondary | ICD-10-CM | POA: Diagnosis not present

## 2021-11-05 DIAGNOSIS — N920 Excessive and frequent menstruation with regular cycle: Secondary | ICD-10-CM | POA: Diagnosis not present

## 2021-11-05 DIAGNOSIS — N946 Dysmenorrhea, unspecified: Secondary | ICD-10-CM | POA: Diagnosis not present

## 2021-12-31 DIAGNOSIS — N920 Excessive and frequent menstruation with regular cycle: Secondary | ICD-10-CM | POA: Diagnosis not present

## 2021-12-31 DIAGNOSIS — N83202 Unspecified ovarian cyst, left side: Secondary | ICD-10-CM | POA: Diagnosis not present

## 2022-01-10 ENCOUNTER — Encounter: Payer: Self-pay | Admitting: Nurse Practitioner

## 2022-01-10 ENCOUNTER — Ambulatory Visit: Payer: 59 | Admitting: Nurse Practitioner

## 2022-01-10 VITALS — BP 130/84 | HR 84 | Temp 97.7°F | Ht 66.0 in | Wt 225.0 lb

## 2022-01-10 DIAGNOSIS — J029 Acute pharyngitis, unspecified: Secondary | ICD-10-CM | POA: Diagnosis not present

## 2022-01-10 DIAGNOSIS — J353 Hypertrophy of tonsils with hypertrophy of adenoids: Secondary | ICD-10-CM | POA: Diagnosis not present

## 2022-01-10 LAB — POCT RAPID STREP A (OFFICE): Rapid Strep A Screen: NEGATIVE

## 2022-01-10 MED ORDER — CETIRIZINE HCL 10 MG PO TABS: 10.0000 mg | ORAL_TABLET | Freq: Every day | ORAL | 0 refills | Status: DC

## 2022-01-10 MED ORDER — FLUTICASONE PROPIONATE 50 MCG/ACT NA SUSP: 2.0000 | Freq: Every day | NASAL | 0 refills | Status: DC

## 2022-01-10 NOTE — Patient Instructions (Signed)
May need referral to ENT if no improvement.

## 2022-01-10 NOTE — Progress Notes (Signed)
Acute Office Visit  Subjective:    Patient ID: Caitlin Foster, female    DOB: 1980-11-11, 41 y.o.   MRN: [id number]  Chief Complaint  Patient presents with   Acute Visit    C/o of sore throat x 2 weeks, w/ some nasal drainage, ears feel clogged. Says it feels like "knives are cutting her throat" whenever she eats or drinks anything, gets worse at night.   Sore Throat  This is a new problem. The current episode started in the past 7 days. The problem has been unchanged. There has been no fever. Associated symptoms include congestion. Pertinent negatives include no abdominal pain, coughing, diarrhea, drooling, ear discharge, ear pain, headaches, hoarse voice, plugged ear sensation, neck pain, shortness of breath, stridor, swollen glands, trouble swallowing or vomiting. She has had no exposure to strep or mono. She has tried nothing for the symptoms.   Outpatient Medications Prior to Visit  Medication Sig   acetaminophen (TYLENOL) 325 MG tablet Take 650 mg by mouth every 6 (six) hours as needed.   Cholecalciferol (VITAMIN D3) 25 MCG (1000 UT) CAPS Take 1 capsule (1,000 Units total) by mouth daily.   ELDERBERRY PO Take by mouth.   Multiple Vitamins-Minerals (MULTIVITAMIN WITH MINERALS) tablet Take 1 tablet by mouth daily.   No facility-administered medications prior to visit.    Reviewed past medical and social history.  Review of Systems  HENT:  Positive for congestion. Negative for drooling, ear discharge, ear pain, hoarse voice and trouble swallowing.   Respiratory:  Negative for cough, shortness of breath and stridor.   Gastrointestinal:  Negative for abdominal pain, diarrhea and vomiting.  Musculoskeletal:  Negative for neck pain.  Neurological:  Negative for headaches.      Objective:    Physical Exam Vitals reviewed.  HENT:     Right Ear: Tympanic membrane, ear canal and external ear normal. There is no impacted cerumen.     Left Ear: Tympanic membrane, ear canal and  external ear normal. There is no impacted cerumen.     Nose: Nose normal.     Mouth/Throat:     Mouth: Mucous membranes are moist.     Tongue: No lesions. Tongue does not deviate from midline.     Palate: No mass and lesions.     Pharynx: Uvula midline. No oropharyngeal exudate or posterior oropharyngeal erythema.     Tonsils: No tonsillar exudate or tonsillar abscesses. 3+ on the right. 3+ on the left.  Cardiovascular:     Rate and Rhythm: Normal rate.     Pulses: Normal pulses.  Pulmonary:     Effort: Pulmonary effort is normal.  Neurological:     Mental Status: She is alert.    BP 130/84 (BP Location: Right Arm, Patient Position: Sitting, Cuff Size: Normal)   Pulse 84   Temp 97.7 F (36.5 C) (Temporal)   Ht '5\' 6"'$  (1.676 m)   Wt 225 lb (102.1 kg)   SpO2 93%   BMI 36.32 kg/m    Results for orders placed or performed in visit on 01/10/22  POCT rapid strep A  Result Value Ref Range   Rapid Strep A Screen Negative Negative      Assessment & Plan:   Problem List Items Addressed This Visit   None Visit Diagnoses     Acute pharyngitis, unspecified etiology    -  Primary   Relevant Medications   cetirizine (ZYRTEC) 10 MG tablet   fluticasone (FLONASE) 50 MCG/ACT nasal  spray   Other Relevant Orders   POCT rapid strep A (Completed)   Enlarged tonsils and adenoids       Relevant Medications   cetirizine (ZYRTEC) 10 MG tablet   fluticasone (FLONASE) 50 MCG/ACT nasal spray      Meds ordered this encounter  Medications   cetirizine (ZYRTEC) 10 MG tablet    Sig: Take 1 tablet (10 mg total) by mouth daily.    Dispense:  30 tablet    Refill:  0    Order Specific Question:   Supervising Provider    Answer:   Abelino Derrick ALFRED [[id number]]   fluticasone (FLONASE) 50 MCG/ACT nasal spray    Sig: Place 2 sprays into both nostrils daily.    Dispense:  16 g    Refill:  0    Order Specific Question:   Supervising Provider    Answer:   Libby Maw [[id number]]  May need  referral to ENT if no improvement due to enlarged tonsils.  Return if symptoms worsen or fail to improve.    Wilfred Lacy, NP

## 2022-01-15 ENCOUNTER — Ambulatory Visit: Payer: 59 | Admitting: Nurse Practitioner

## 2022-01-23 DIAGNOSIS — N946 Dysmenorrhea, unspecified: Secondary | ICD-10-CM | POA: Diagnosis not present

## 2022-01-23 DIAGNOSIS — N920 Excessive and frequent menstruation with regular cycle: Secondary | ICD-10-CM | POA: Diagnosis not present

## 2022-03-13 ENCOUNTER — Ambulatory Visit (INDEPENDENT_AMBULATORY_CARE_PROVIDER_SITE_OTHER): Payer: 59 | Admitting: Nurse Practitioner

## 2022-03-13 ENCOUNTER — Encounter: Payer: Self-pay | Admitting: Nurse Practitioner

## 2022-03-13 VITALS — BP 122/78 | HR 82 | Temp 97.8°F | Ht 66.0 in | Wt 224.4 lb

## 2022-03-13 DIAGNOSIS — E559 Vitamin D deficiency, unspecified: Secondary | ICD-10-CM

## 2022-03-13 DIAGNOSIS — Z87891 Personal history of nicotine dependence: Secondary | ICD-10-CM

## 2022-03-13 DIAGNOSIS — F339 Major depressive disorder, recurrent, unspecified: Secondary | ICD-10-CM

## 2022-03-13 DIAGNOSIS — E6609 Other obesity due to excess calories: Secondary | ICD-10-CM | POA: Diagnosis not present

## 2022-03-13 DIAGNOSIS — E782 Mixed hyperlipidemia: Secondary | ICD-10-CM | POA: Diagnosis not present

## 2022-03-13 DIAGNOSIS — Z6836 Body mass index (BMI) 36.0-36.9, adult: Secondary | ICD-10-CM | POA: Diagnosis not present

## 2022-03-13 DIAGNOSIS — Z0001 Encounter for general adult medical examination with abnormal findings: Secondary | ICD-10-CM | POA: Diagnosis not present

## 2022-03-13 MED ORDER — WEGOVY 0.25 MG/0.5ML ~~LOC~~ SOAJ: 0.2500 mg | SUBCUTANEOUS | 0 refills | Status: DC

## 2022-03-13 NOTE — Progress Notes (Addendum)
Complete physical exam  Patient: Caitlin Foster   DOB: Sep 13, 1980   41 y.o. Female  MRN: [id number] Visit Date: 03/26/2022  Subjective:    Chief Complaint  Patient presents with   Annual Exam    CPE Pt not fasting  Wants to discuss weight loss options.    Caitlin Foster is a 41 y.o. female who presents today for a complete physical exam. She reports consuming a general diet. She generally feels fairly well. She reports sleeping fairly well. She does have additional problems to discuss today.  Vision:Yes Dental:yes STD Screen:No  Most recent fall risk assessment:    03/13/2022    1:06 PM  [place] in the past year? 0  Number falls in past yr: 0  Injury with Fall? 0   Most recent depression screenings:    03/13/2022    1:33 PM 06/10/2021    8:35 AM  PHQ 2/9 Scores  PHQ - 2 Score 1 1  PHQ- 9 Score 2 3   HPI  Former tobacco use Quit 01/2022 without use of chantix of patch or gum. Improved sorethroat after discontinuation of tobacco products  Class 2 obesity due to excess calories with body mass index (BMI) of 36.0 to 36.9 in adult Exercise: Walking 2x/week x 1hr Diet modification: home cooked meals-low fat, eliminated sugary drinks, increased daily oral hydration with water. Wt Readings from Last 3 Encounters:  03/13/22 224 lb 6.4 oz (101.8 kg)  01/10/22 225 lb (102.1 kg)  06/10/21 222 lb 12.8 oz (101.1 kg)    We discussed use of GLP-1, possible side effects and contraindications. She verbalized understanding. Advised to use Myfitnesspal app to track daily caloric intake. Maintain 133mns per week of moderate intensity exercise. Maintain heart healthy diet. Check CMP and CBC Start wegovy 0.'25mg'$  F/up in 160monthMixed hyperlipidemia Repeat lipid panel Maintain heart healthy diet and exercise  Depression, recurrent (HCC) Stable No need for medication or psychology referral at this time  Vitamin D deficiency Repeat vit. D   Past Medical  History:  Diagnosis Date   Scoliosis    Past Surgical History:  Procedure Laterality Date   BACK SURGERY     Harrington Rods secondary to scoliosis   scoli     Social History   Socioeconomic History   Marital status: Single    Spouse name: Not on file   Number of children: 2   Years of education: Not on file   Highest education level: Not on file  Occupational History   Occupation: LPN  Tobacco Use   Smoking status: Former    Packs/day: 0.50    Years: 10.00    Total pack years: 5.00    Types: E-cigarettes, Cigarettes    Quit date: 01/13/2022    Years since quitting: 0.1   Smokeless tobacco: Never   Tobacco comments:    spoke newports, menthol  Vaping Use   Vaping Use: Never used  Substance and Sexual Activity   Alcohol use: Yes    Comment: social   Drug use: No   Sexual activity: Not Currently  Other Topics Concern   Not on file  Social History Narrative   Not on file   Social Determinants of Health   Financial Resource Strain: Not on file  Food Insecurity: Not on file  Transportation Needs: Not on file  Physical Activity: Not on file  Stress: Not on file  Social Connections: Not on file  Intimate Partner Violence: Not  on file   Family Status  Relation Name Status   Mother  Alive   Father  Alive   Mat Aunt  (Not Specified)   Pat Aunt  Deceased   MGM  (Not Specified)   PGM  Deceased   Family History  Problem Relation Age of Onset   Diabetes Mother    Depression Mother    Cancer Father 63       prostate   Depression Maternal Aunt    Early death Paternal Aunt 72   Heart attack Paternal Aunt    Diabetes Maternal Grandmother    Early death Paternal Grandmother 7   Allergies  Allergen Reactions   Levaquin  [Levofloxacin In D5w] Itching   Levofloxacin Itching   Levofloxacin Hives   Sulfa Antibiotics Rash   Sulfamethoxazole-Trimethoprim Rash    Patient Care Team: Meadow Abramo, Charlene Brooke, NP as PCP - General (Internal Medicine)    Medications: Outpatient Medications Prior to Visit  Medication Sig   acetaminophen (TYLENOL) 325 MG tablet Take 650 mg by mouth every 6 (six) hours as needed.   Cholecalciferol (VITAMIN D3) 25 MCG (1000 UT) CAPS Take 1 capsule (1,000 Units total) by mouth daily.   fluticasone (FLONASE) 50 MCG/ACT nasal spray Place 2 sprays into both nostrils daily.   Multiple Vitamins-Minerals (MULTIVITAMIN WITH MINERALS) tablet Take 1 tablet by mouth daily.   ELDERBERRY PO Take by mouth.   [DISCONTINUED] cetirizine (ZYRTEC) 10 MG tablet Take 1 tablet (10 mg total) by mouth daily. (Patient not taking: Reported on 03/13/2022)   No facility-administered medications prior to visit.    Review of Systems  Constitutional:  Negative for fever.  HENT:  Negative for congestion and sore throat.   Eyes:        Negative for visual changes  Respiratory:  Negative for cough and shortness of breath.   Cardiovascular:  Negative for chest pain, palpitations and leg swelling.  Gastrointestinal:  Negative for blood in stool, constipation and diarrhea.  Genitourinary:  Negative for dysuria, frequency and urgency.  Musculoskeletal:  Negative for myalgias.  Skin:  Negative for rash.  Neurological:  Negative for dizziness and headaches.  Hematological:  Does not bruise/bleed easily.  Psychiatric/Behavioral:  Negative for suicidal ideas. The patient is not nervous/anxious.     Last lipids Lab Results  Component Value Date   CHOL 210 (H) 03/14/2022   HDL 42.30 03/14/2022   LDLCALC 148 (H) 03/14/2022   TRIG 102.0 03/14/2022   CHOLHDL 5 03/14/2022   Last thyroid functions Lab Results  Component Value Date   TSH 2.05 11/26/2020   Last vitamin D Lab Results  Component Value Date   VD25OH 17.82 (L) 03/14/2022        Objective:  BP 122/78 (BP Location: Right Arm, Patient Position: Sitting, Cuff Size: Normal)   Pulse 82   Temp 97.8 F (36.6 C) (Temporal)   Ht '5\' 6"'$  (1.676 m)   Wt 224 lb 6.4 oz (101.8 kg)    LMP 02/23/2022 (Exact Date)   SpO2 93%   BMI 36.22 kg/m     BP Readings from Last 3 Encounters:  03/13/22 122/78  01/10/22 130/84  06/10/21 118/86   Physical Exam Vitals reviewed.  Constitutional:      General: She is not in acute distress.    Appearance: She is well-developed. She is obese.  HENT:     Right Ear: Tympanic membrane, ear canal and external ear normal.     Left Ear: Tympanic membrane, ear canal  and external ear normal.     Nose: Nose normal.  Eyes:     Extraocular Movements: Extraocular movements intact.     Conjunctiva/sclera: Conjunctivae normal.  Cardiovascular:     Rate and Rhythm: Normal rate and regular rhythm.     Pulses: Normal pulses.     Heart sounds: Normal heart sounds.  Pulmonary:     Effort: Pulmonary effort is normal. No respiratory distress.     Breath sounds: Normal breath sounds.  Chest:     Chest wall: No tenderness.  Breasts:    Breasts are symmetrical.     Right: Normal.     Left: Normal.  Abdominal:     General: Bowel sounds are normal.     Palpations: Abdomen is soft.  Musculoskeletal:        General: Normal range of motion.     Cervical back: Normal range of motion and neck supple.     Right lower leg: No edema.     Left lower leg: No edema.  Lymphadenopathy:     Upper Body:     Right upper body: No supraclavicular, axillary or pectoral adenopathy.     Left upper body: No supraclavicular, axillary or pectoral adenopathy.  Skin:    General: Skin is warm and dry.  Neurological:     Mental Status: She is alert and oriented to person, place, and time.     Deep Tendon Reflexes: Reflexes are normal and symmetric.  Psychiatric:        Mood and Affect: Mood normal.        Behavior: Behavior normal.        Thought Content: Thought content normal.     No results found for any visits on 03/13/22.    Assessment & Plan:    Routine Health Maintenance and Physical Exam  Immunization History  Administered Date(s) Administered    Influenza,inj,Quad PF,6+ Mos 10/06/2017   Influenza-Unspecified 08/27/2017, 05/04/2019   PFIZER(Purple Top)SARS-COV-2 Vaccination 02/28/2020, 03/20/2020, 10/22/2020    Health Maintenance  Topic Date Due   INFLUENZA VACCINE  03/04/2022   COVID-19 Vaccine (4 - Pfizer series) 03/29/2022 (Originally 12/17/2020)   Hepatitis C Screening  03/14/2023 (Originally 01/19/2001)   PAP SMEAR-Modifier  08/22/2022   TETANUS/TDAP  06/04/2024   HIV Screening  Completed   HPV VACCINES  Aged Out   Discussed health benefits of physical activity, and encouraged her to engage in regular exercise appropriate for her age and condition.  Problem List Items Addressed This Visit       Other   Class 2 obesity due to excess calories with body mass index (BMI) of 36.0 to 36.9 in adult    Exercise: Walking 2x/week x 1hr Diet modification: home cooked meals-low fat, eliminated sugary drinks, increased daily oral hydration with water. Wt Readings from Last 3 Encounters:  03/13/22 224 lb 6.4 oz (101.8 kg)  01/10/22 225 lb (102.1 kg)  06/10/21 222 lb 12.8 oz (101.1 kg)    We discussed use of GLP-1, possible side effects and contraindications. She verbalized understanding. Advised to use Myfitnesspal app to track daily caloric intake. Maintain 178mns per week of moderate intensity exercise. Maintain heart healthy diet. Check CMP and CBC Start wegovy 0.'25mg'$  F/up in 164month    Relevant Medications   Semaglutide-Weight Management (WEGOVY) 0.25 MG/0.5ML SOAJ   Other Relevant Orders   Lipid panel (Completed)   Depression, recurrent ([facility]   Stable No need for medication or psychology referral at this time  Former tobacco use    Quit 01/2022 without use of chantix of patch or gum. Improved sorethroat after discontinuation of tobacco products      Mixed hyperlipidemia    Repeat lipid panel Maintain heart healthy diet and exercise      Relevant Orders   Lipid panel (Completed)   Vitamin D  deficiency    Repeat vit. D      Relevant Orders   VITAMIN D 25 Hydroxy (Vit-D Deficiency, Fractures) (Completed)   Other Visit Diagnoses     Encounter for preventative adult health care exam with abnormal findings    -  Primary   Relevant Orders   CBC (Completed)   Comprehensive metabolic panel (Completed)      Return in about 4 weeks (around 04/10/2022) for Weight management.     Wilfred Lacy, NP

## 2022-03-13 NOTE — Assessment & Plan Note (Signed)
Stable No need for medication or psychology referral at this time

## 2022-03-13 NOTE — Patient Instructions (Addendum)
Myfitnesspal app to track daily caloric intake. Maintain 138mns per week of moderate intensity exercise. Maintain heart healthy diet. Schedule fasting lab appt Start wegovy F/up in 179month Calorie Counting for Weight Loss Calories are units of energy. Your body needs a certain number of calories from food to keep going throughout the day. When you eat or drink more calories than your body needs, your body stores the extra calories mostly as fat. When you eat or drink fewer calories than your body needs, your body burns fat to get the energy it needs. Calorie counting means keeping track of how many calories you eat and drink each day. Calorie counting can be helpful if you need to lose weight. If you eat fewer calories than your body needs, you should lose weight. Ask your health care provider what a healthy weight is for you. For calorie counting to work, you will need to eat the right number of calories each day to lose a healthy amount of weight per week. A dietitian can help you figure out how many calories you need in a day and will suggest ways to reach your calorie goal. A healthy amount of weight to lose each week is usually 1-2 lb (0.5-0.9 kg). This usually means that your daily calorie intake should be reduced by 500-750 calories. Eating 1,200-1,500 calories a day can help most women lose weight. Eating 1,500-1,800 calories a day can help most men lose weight. What do I need to know about calorie counting? Work with your health care provider or dietitian to determine how many calories you should get each day. To meet your daily calorie goal, you will need to: Find out how many calories are in each food that you would like to eat. Try to do this before you eat. Decide how much of the food you plan to eat. Keep a food log. Do this by writing down what you ate and how many calories it had. To successfully lose weight, it is important to balance calorie counting with a healthy lifestyle  that includes regular activity. Where do I find calorie information?  The number of calories in a food can be found on a Nutrition Facts label. If a food does not have a Nutrition Facts label, try to look up the calories online or ask your dietitian for help. Remember that calories are listed per serving. If you choose to have more than one serving of a food, you will have to multiply the calories per serving by the number of servings you plan to eat. For example, the label on a package of bread might say that a serving size is 1 slice and that there are 90 calories in a serving. If you eat 1 slice, you will have eaten 90 calories. If you eat 2 slices, you will have eaten 180 calories. How do I keep a food log? After each time that you eat, record the following in your food log as soon as possible: What you ate. Be sure to include toppings, sauces, and other extras on the food. How much you ate. This can be measured in cups, ounces, or number of items. How many calories were in each food and drink. The total number of calories in the food you ate. Keep your food log near you, such as in a pocket-sized notebook or on an app or website on your mobile phone. Some programs will calculate calories for you and show you how many calories you have left to meet your  daily goal. What are some portion-control tips? Know how many calories are in a serving. This will help you know how many servings you can have of a certain food. Use a measuring cup to measure serving sizes. You could also try weighing out portions on a kitchen scale. With time, you will be able to estimate serving sizes for some foods. Take time to put servings of different foods on your favorite plates or in your favorite bowls and cups so you know what a serving looks like. Try not to eat straight from a food's packaging, such as from a bag or box. Eating straight from the package makes it hard to see how much you are eating and can lead to  overeating. Put the amount you would like to eat in a cup or on a plate to make sure you are eating the right portion. Use smaller plates, glasses, and bowls for smaller portions and to prevent overeating. Try not to multitask. For example, avoid watching TV or using your computer while eating. If it is time to eat, sit down at a table and enjoy your food. This will help you recognize when you are full. It will also help you be more mindful of what and how much you are eating. What are tips for following this plan? Reading food labels Check the calorie count compared with the serving size. The serving size may be smaller than what you are used to eating. Check the source of the calories. Try to choose foods that are high in protein, fiber, and vitamins, and low in saturated fat, trans fat, and sodium. Shopping Read nutrition labels while you shop. This will help you make healthy decisions about which foods to buy. Pay attention to nutrition labels for low-fat or fat-free foods. These foods sometimes have the same number of calories or more calories than the full-fat versions. They also often have added sugar, starch, or salt to make up for flavor that was removed with the fat. Make a grocery list of lower-calorie foods and stick to it. Cooking Try to cook your favorite foods in a healthier way. For example, try baking instead of frying. Use low-fat dairy products. Meal planning Use more fruits and vegetables. One-half of your plate should be fruits and vegetables. Include lean proteins, such as chicken, [place], and fish. Lifestyle Each week, aim to do one of the following: 150 minutes of moderate exercise, such as walking. 75 minutes of vigorous exercise, such as running. General information Know how many calories are in the foods you eat most often. This will help you calculate calorie counts faster. Find a way of tracking calories that works for you. Get creative. Try different apps or  programs if writing down calories does not work for you. What foods should I eat?  Eat nutritious foods. It is better to have a nutritious, high-calorie food, such as an avocado, than a food with few nutrients, such as a bag of potato chips. Use your calories on foods and drinks that will fill you up and will not leave you hungry soon after eating. Examples of foods that fill you up are nuts and nut butters, vegetables, lean proteins, and high-fiber foods such as whole grains. High-fiber foods are foods with more than 5 g of fiber per serving. Pay attention to calories in drinks. Low-calorie drinks include water and unsweetened drinks. The items listed above may not be a complete list of foods and beverages you can eat. Contact a dietitian for  more information. What foods should I limit? Limit foods or drinks that are not good sources of vitamins, minerals, or protein or that are high in unhealthy fats. These include: Candy. Other sweets. Sodas, specialty coffee drinks, alcohol, and juice. The items listed above may not be a complete list of foods and beverages you should avoid. Contact a dietitian for more information. How do I count calories when eating out? Pay attention to portions. Often, portions are much larger when eating out. Try these tips to keep portions smaller: Consider sharing a meal instead of getting your own. If you get your own meal, eat only half of it. Before you start eating, ask for a container and put half of your meal into it. When available, consider ordering smaller portions from the menu instead of full portions. Pay attention to your food and drink choices. Knowing the way food is cooked and what is included with the meal can help you eat fewer calories. If calories are listed on the menu, choose the lower-calorie options. Choose dishes that include vegetables, fruits, whole grains, low-fat dairy products, and lean proteins. Choose items that are boiled, broiled,  grilled, or steamed. Avoid items that are buttered, battered, fried, or served with cream sauce. Items labeled as crispy are usually fried, unless stated otherwise. Choose water, low-fat milk, unsweetened iced tea, or other drinks without added sugar. If you want an alcoholic beverage, choose a lower-calorie option, such as a glass of wine or light beer. Ask for dressings, sauces, and syrups on the side. These are usually high in calories, so you should limit the amount you eat. If you want a salad, choose a garden salad and ask for grilled meats. Avoid extra toppings such as bacon, cheese, or fried items. Ask for the dressing on the side, or ask for olive oil and vinegar or lemon to use as dressing. Estimate how many servings of a food you are given. Knowing serving sizes will help you be aware of how much food you are eating at restaurants. Where to find more information Centers for Disease Control and Prevention: http://www.wolf.info/ [place] Department of Agriculture: http://www.wilson-mendoza.org/ Summary Calorie counting means keeping track of how many calories you eat and drink each day. If you eat fewer calories than your body needs, you should lose weight. A healthy amount of weight to lose per week is usually 1-2 lb (0.5-0.9 kg). This usually means reducing your daily calorie intake by 500-750 calories. The number of calories in a food can be found on a Nutrition Facts label. If a food does not have a Nutrition Facts label, try to look up the calories online or ask your dietitian for help. Use smaller plates, glasses, and bowls for smaller portions and to prevent overeating. Use your calories on foods and drinks that will fill you up and not leave you hungry shortly after a meal. This information is not intended to replace advice given to you by your health care provider. Make sure you discuss any questions you have with your health care provider. Document Revised: 09/01/2019 Document Reviewed: 09/01/2019 Elsevier Patient  Education  [place].

## 2022-03-13 NOTE — Assessment & Plan Note (Addendum)
Exercise: Walking 2x/week x 1hr Diet modification: home cooked meals-low fat, eliminated sugary drinks, increased daily oral hydration with water. Wt Readings from Last 3 Encounters:  03/13/22 224 lb 6.4 oz (101.8 kg)  01/10/22 225 lb (102.1 kg)  06/10/21 222 lb 12.8 oz (101.1 kg)    We discussed use of GLP-1, possible side effects and contraindications. She verbalized understanding. Advised to use Myfitnesspal app to track daily caloric intake. Maintain 163mns per week of moderate intensity exercise. Maintain heart healthy diet. Check CMP and CBC Start wegovy 0.'25mg'$  F/up in 199month

## 2022-03-13 NOTE — Assessment & Plan Note (Signed)
Repeat lipid panel Maintain heart healthy diet and exercise

## 2022-03-13 NOTE — Assessment & Plan Note (Signed)
Quit 01/2022 without use of chantix of patch or gum. Improved sorethroat after discontinuation of tobacco products

## 2022-03-13 NOTE — Assessment & Plan Note (Signed)
Repeat vit. D °

## 2022-03-14 ENCOUNTER — Other Ambulatory Visit (INDEPENDENT_AMBULATORY_CARE_PROVIDER_SITE_OTHER): Payer: 59

## 2022-03-14 DIAGNOSIS — E559 Vitamin D deficiency, unspecified: Secondary | ICD-10-CM | POA: Diagnosis not present

## 2022-03-14 DIAGNOSIS — Z6836 Body mass index (BMI) 36.0-36.9, adult: Secondary | ICD-10-CM | POA: Diagnosis not present

## 2022-03-14 DIAGNOSIS — E782 Mixed hyperlipidemia: Secondary | ICD-10-CM

## 2022-03-14 DIAGNOSIS — Z0001 Encounter for general adult medical examination with abnormal findings: Secondary | ICD-10-CM

## 2022-03-14 LAB — LIPID PANEL
Cholesterol: 210 mg/dL — ABNORMAL HIGH (ref 0–200)
HDL: 42.3 mg/dL (ref >39.00)
LDL Cholesterol: 148 mg/dL — ABNORMAL HIGH (ref 0–99)
NonHDL: 168.07
Total CHOL/HDL Ratio: 5
Triglycerides: 102 mg/dL (ref 0.0–149.0)
VLDL: 20.4 mg/dL (ref 0.0–40.0)

## 2022-03-14 LAB — CBC
HCT: 37 % (ref 36.0–46.0)
Hemoglobin: 12.3 g/dL (ref 12.0–15.0)
MCHC: 33.2 g/dL (ref 30.0–36.0)
MCV: 86.2 fl (ref 78.0–100.0)
Platelets: 368 10*3/uL (ref 150.0–400.0)
RBC: 4.29 Mil/uL (ref 3.87–5.11)
RDW: 13.9 % (ref 11.5–15.5)
WBC: 7.6 10*3/uL (ref 4.0–10.5)

## 2022-03-14 LAB — COMPREHENSIVE METABOLIC PANEL
ALT: 13 U/L (ref 0–35)
AST: 18 U/L (ref 0–37)
Albumin: 4.3 g/dL (ref 3.5–5.2)
Alkaline Phosphatase: 75 U/L (ref 39–117)
BUN: 9 mg/dL (ref 6–23)
CO2: 27 mEq/L (ref 19–32)
Calcium: 9.8 mg/dL (ref 8.4–10.5)
Chloride: 104 mEq/L (ref 96–112)
Creatinine, Ser: 0.85 mg/dL (ref 0.40–1.20)
GFR: 86.47 mL/min (ref >60.00)
Glucose, Bld: 108 mg/dL — ABNORMAL HIGH (ref 70–99)
Potassium: 4.4 mEq/L (ref 3.5–5.1)
Sodium: 139 mEq/L (ref 135–145)
Total Bilirubin: 0.4 mg/dL (ref 0.2–1.2)
Total Protein: 7.7 g/dL (ref 6.0–8.3)

## 2022-03-14 LAB — VITAMIN D 25 HYDROXY (VIT D DEFICIENCY, FRACTURES): VITD: 17.82 ng/mL — ABNORMAL LOW (ref 30.00–100.00)

## 2022-03-17 MED ORDER — VITAMIN D (ERGOCALCIFEROL) 1.25 MG (50000 UNIT) PO CAPS: 50000.0000 [IU] | ORAL_CAPSULE | ORAL | 0 refills | Status: DC

## 2022-03-17 NOTE — Addendum Note (Signed)
Addended by: Leana Gamer on: 03/17/2022 03:41 PM   Modules accepted: Orders

## 2022-03-20 ENCOUNTER — Other Ambulatory Visit: Payer: Self-pay | Admitting: Nurse Practitioner

## 2022-03-24 ENCOUNTER — Encounter: Payer: Self-pay | Admitting: Nurse Practitioner

## 2022-04-04 ENCOUNTER — Encounter (HOSPITAL_BASED_OUTPATIENT_CLINIC_OR_DEPARTMENT_OTHER): Payer: Self-pay | Admitting: Obstetrics and Gynecology

## 2022-04-08 ENCOUNTER — Encounter (HOSPITAL_BASED_OUTPATIENT_CLINIC_OR_DEPARTMENT_OTHER): Payer: Self-pay | Admitting: Obstetrics and Gynecology

## 2022-04-08 ENCOUNTER — Other Ambulatory Visit: Payer: Self-pay

## 2022-04-08 NOTE — Progress Notes (Signed)
Your procedure is scheduled on Tuesday, 04/22/22.  Report to [facility]   Call this number if you have problems the morning of surgery  :[phone].   OUR ADDRESS IS [address].  WE ARE LOCATED IN [facility].  PLEASE BRING YOUR INSURANCE CARD AND PHOTO ID DAY OF SURGERY.  ONLY 2 PEOPLE ARE ALLOWED IN  WAITING  ROOM.                                      REMEMBER:  DO NOT EAT FOOD, CANDY GUM OR MINTS  AFTER MIDNIGHT THE NIGHT BEFORE YOUR SURGERY . YOU MAY HAVE CLEAR LIQUIDS FROM MIDNIGHT THE NIGHT BEFORE YOUR SURGERY UNTIL  6:45 AM. NO CLEAR LIQUIDS AFTER   6:45 AM DAY OF SURGERY.  YOU MAY  BRUSH YOUR TEETH MORNING OF SURGERY AND RINSE YOUR MOUTH OUT, NO CHEWING GUM CANDY OR MINTS.     CLEAR LIQUID DIET   Foods Allowed                                                                     Foods Excluded  Coffee and tea, regular and decaf                             liquids that you cannot  Plain Jell-O                                                                   see through such as: Fruit ices (not with fruit pulp)                                     milk, soups, orange juice  Plain  Popsicles                                    All solid food Carbonated beverages, regular and diet                                    Cranberry, grape and apple juices Sports drinks like Gatorade _____________________________________________________________________     TAKE THESE MEDICATIONS MORNING OF SURGERY: NONE    UP TO 4 VISITORS  MAY VISIT IN THE EXTENDED RECOVERY ROOM UNTIL 800 PM ONLY.  ONE  VISITOR AGE 39 AND OVER MAY SPEND THE NIGHT AND MUST BE IN EXTENDED RECOVERY ROOM NO LATER THAN 800 PM . YOUR DISCHARGE TIME AFTER YOU SPEND THE NIGHT IS 900 AM THE MORNING AFTER YOUR SURGERY.  YOU MAY PACK A SMALL OVERNIGHT BAG WITH TOILETRIES FOR YOUR OVERNIGHT STAY IF  YOU WISH.  YOUR PRESCRIPTION MEDICATIONS WILL BE PROVIDED DURING  [place].                                      DO NOT WEAR JEWERLY, MAKE UP. DO NOT WEAR LOTIONS, POWDERS, PERFUMES OR NAIL POLISH ON YOUR FINGERNAILS. TOENAIL POLISH IS OK TO WEAR. DO NOT SHAVE FOR 48 HOURS PRIOR TO DAY OF SURGERY. MEN MAY SHAVE FACE AND NECK. CONTACTS, GLASSES, OR DENTURES MAY NOT BE WORN TO SURGERY.  REMEMBER: NO SMOKING, DRUGS OR ALCOHOL FOR 24 HOURS BEFORE YOUR SURGERY.                                    [facility] IS NOT RESPONSIBLE  FOR ANY BELONGINGS.                                                                    Marland Kitchen           [facility] - Preparing for Surgery Before surgery, you can play an important role.  Because skin is not sterile, your skin needs to be as free of germs as possible.  You can reduce the number of germs on your skin by washing with CHG (chlorahexidine gluconate) soap before surgery.  CHG is an antiseptic cleaner which kills germs and bonds with the skin to continue killing germs even after washing. Please DO NOT use if you have an allergy to CHG or antibacterial soaps.  If your skin becomes reddened/irritated stop using the CHG and inform your nurse when you arrive at Short Stay. Do not shave (including legs and underarms) for at least 48 hours prior to the first CHG shower.  You may shave your face/neck. Please follow these instructions carefully:  1.  Shower with CHG Soap the night before surgery and the  morning of Surgery.  2.  If you choose to wash your hair, wash your hair first as usual with your  normal  shampoo.  3.  After you shampoo, rinse your hair and body thoroughly to remove the  shampoo.                            4.  Use CHG as you would any other liquid soap.  You can apply chg directly  to the skin and wash , please wash your belly button thoroughly with chg soap provided night before and morning of your surgery.                     Gently with a scrungie or clean washcloth.  5.  Apply the CHG Soap to your body  ONLY FROM THE NECK DOWN.   Do not use on face/ open                           Wound or open sores. Avoid contact with eyes, ears mouth and genitals (private parts).  Wash face,  Genitals (private parts) with your normal soap.             6.  Wash thoroughly, paying special attention to the area where your surgery  will be performed.  7.  Thoroughly rinse your body with warm water from the neck down.  8.  DO NOT shower/wash with your normal soap after using and rinsing off  the CHG Soap.                9.  Pat yourself dry with a clean towel.            10.  Wear clean pajamas.            11.  Place clean sheets on your bed the night of your first shower and do not  sleep with pets. Day of Surgery : Do not apply any lotions/deodorants the morning of surgery.  Please wear clean clothes to the hospital/surgery center.  IF YOU HAVE ANY SKIN IRRITATION OR PROBLEMS WITH THE SURGICAL SOAP, PLEASE GET A BAR OF GOLD DIAL SOAP AND SHOWER THE NIGHT BEFORE YOUR SURGERY AND THE MORNING OF YOUR SURGERY. PLEASE LET THE NURSE KNOW MORNING OF YOUR SURGERY IF YOU HAD ANY PROBLEMS WITH THE SURGICAL SOAP.   ________________________________________________________________________                                                        QUESTIONS [facility] PRE OP NURSE PHONE [phone].

## 2022-04-08 NOTE — Progress Notes (Signed)
Spoke w/ via phone for pre-op interview---Caitlin Foster needs dos---- urine pregnancy per anesthesia, surgeon orders pending as of 04/08/22             Foster results------04/18/22 Foster appt for cbc, type & screen COVID test -----patient states asymptomatic no test needed Arrive at -------[name] on Tuesday, 04/22/22 NPO after MN NO Solid Food.  Clear liquids from MN until---0645 Med rec completed Medications to take morning of surgery -----NONE Diabetic medication -----n/a Patient instructed no nail polish to be worn day of surgery Patient instructed to bring photo id and insurance card day of surgery Patient aware to have Driver (ride ) / caregiver    for 24 hours after surgery - mom, Caitlin Foster Patient Special Instructions -----Extended / overnight stay instructions given. Pre-Op special Istructions -----Requested orders via Epic IB from Dr. Landry Mellow on 04/04/22. Patient verbalized understanding of instructions that were given at this phone interview. Patient denies shortness of breath, chest pain, fever, cough at this phone interview.

## 2022-04-10 DIAGNOSIS — N946 Dysmenorrhea, unspecified: Secondary | ICD-10-CM | POA: Diagnosis not present

## 2022-04-10 DIAGNOSIS — N920 Excessive and frequent menstruation with regular cycle: Secondary | ICD-10-CM | POA: Diagnosis not present

## 2022-04-17 ENCOUNTER — Other Ambulatory Visit (HOSPITAL_COMMUNITY): Payer: Self-pay

## 2022-04-17 DIAGNOSIS — R7301 Impaired fasting glucose: Secondary | ICD-10-CM | POA: Diagnosis not present

## 2022-04-17 DIAGNOSIS — Z1331 Encounter for screening for depression: Secondary | ICD-10-CM | POA: Diagnosis not present

## 2022-04-17 DIAGNOSIS — E559 Vitamin D deficiency, unspecified: Secondary | ICD-10-CM | POA: Diagnosis not present

## 2022-04-17 DIAGNOSIS — E8889 Other specified metabolic disorders: Secondary | ICD-10-CM | POA: Diagnosis not present

## 2022-04-17 DIAGNOSIS — R5383 Other fatigue: Secondary | ICD-10-CM | POA: Diagnosis not present

## 2022-04-17 MED ORDER — WEGOVY 0.25 MG/0.5ML ~~LOC~~ SOAJ
0.2500 mg | SUBCUTANEOUS | 0 refills | Status: DC
Start: 2022-04-17 — End: 2022-04-23
  Filled 2022-04-17: qty 2, 28d supply, fill #0

## 2022-04-17 MED ORDER — TECHLITE PEN NEEDLES 32G X 4 MM MISC
0 refills | Status: DC
  Filled 2022-04-17: qty 100, 90d supply, fill #0

## 2022-04-17 MED ORDER — SAXENDA 18 MG/3ML ~~LOC~~ SOPN
0.6000 mg | PEN_INJECTOR | Freq: Every day | SUBCUTANEOUS | 1 refills | Status: DC
  Filled 2022-04-17: qty 15, 30d supply, fill #0

## 2022-04-18 ENCOUNTER — Encounter (HOSPITAL_COMMUNITY)
Admission: RE | Admit: 2022-04-18 | Discharge: 2022-04-18 | Disposition: A | Discharge status: Home / Self Care | Payer: 59 | Source: Ambulatory Visit | Attending: Obstetrics and Gynecology | Admitting: Obstetrics and Gynecology

## 2022-04-18 DIAGNOSIS — Z01812 Encounter for preprocedural laboratory examination: Secondary | ICD-10-CM | POA: Insufficient documentation

## 2022-04-18 DIAGNOSIS — Z01818 Encounter for other preprocedural examination: Secondary | ICD-10-CM

## 2022-04-18 LAB — CBC
HCT: 38.6 % (ref 36.0–46.0)
Hemoglobin: 12.6 g/dL (ref 12.0–15.0)
MCH: 29 pg (ref 26.0–34.0)
MCHC: 32.6 g/dL (ref 30.0–36.0)
MCV: 88.9 fL (ref 80.0–100.0)
Platelets: 393 10*3/uL (ref 150–400)
RBC: 4.34 MIL/uL (ref 3.87–5.11)
RDW: 13 % (ref 11.5–15.5)
WBC: 7.6 10*3/uL (ref 4.0–10.5)
nRBC: 0 % (ref 0.0–0.2)

## 2022-04-21 ENCOUNTER — Other Ambulatory Visit: Payer: Self-pay | Admitting: Obstetrics and Gynecology

## 2022-04-21 DIAGNOSIS — N920 Excessive and frequent menstruation with regular cycle: Secondary | ICD-10-CM

## 2022-04-21 NOTE — H&P (Signed)
Subjective:    Chief Complaint(s): Heavy painful menses.     PreOp for 04/22/22/ Heavy painful menses.    HPI:      General          41 y/o presents for preop visit. Pt is schedule for robotic assisted laparoscopic hysterectomy with bilateral salpingectomy on 04/22/2022 for management of dysmenorrhea and menorrhagia with regular cycle.            History significant for :            Previously, patient reported monthly menses without intermenstrual bleeding. Menses typically lasted 5 to 6 days with 4 heavy days. Would wear 2 pads together, changing 4 times over 8 hours. Reported silver dollar sized clots.            She also reported significant dysmenorrhea starting up to 1 week prior to menses, with pain radiating up to chest. Has tried heating pad, Tylenol, ibuprofen - help some.            She also reported increased frequency of headaches around menses, as well as menstrual brain fog and N/V. Menses becoming heavier and more painful over time.            She does not desire future pregnancy and is not interested in hormonal contraception for regulation of menses as she does not want increased risk for VTE. She has not started Lysteda for management of AUB for the same reason. She is no longer smoking - has been 2 weeks since she last smoked.            TVUS May30, 2023 notable for uterus measuring 9.51 x 5.3 x 6.78 cm with 1.09 cm endometrial stripe. 2 small uterine fibroids observed (1.9 cm and 0.9 cm), stable in size from previous US 11/05/2021. Nabothian cysts cervix.            Right ovary WNL - previous cyst resolved. Left ovary complex cyst 1.9 cm, stable from previous US, avascular. No adnexal mass seen.    Current Medication:     Taking    Vitamin D (Ergocalciferol) 50000 UNIT Capsule 1 capsule Orally Once a Week.   Medication List reviewed and reconciled with the patient.     Medical History:    Medical History Verified.     Allergies/Intolerance:      Sulfa Antibiotics: Allergy       Levaquin: Allergy    Gyn History:    Sexual activity not currently sexually active.  Periods : Every 24 days.  LMP 03/19/2022.  Denies Birth control.  Last pap smear date 2 year ago.  Denies Last mammogram date.  Abnormal pap smear has had in the past.  STD [facility].  Menarche 23.     OB History:    Number of pregnancies  4.  miscarriages  1.  abortion  1.  Pregnancy # 1  09/06/2006 boy, , vaginal delivery.  Pregnancy # 2  12/15/2009 , vaginal delivery , girl.      Surgical History:    spinal fusion d/t 1996     Hospitalization:    No Hospitalization History.     Family History:   Father: alive 46 yrs, diagnosed with Prostate CA   Mother: alive 29 yrs, diagnosed with Diabetes   Paternal [place] Father: deceased   Paternal [place] Mother: deceased   Maternal Grand Father: deceased, diagnosed with Prostate CA, Diabetes   Maternal Grand Mother: deceased, diagnosed with Diabetes   Brother 1:  alive   Children: alive   1 brother(s) . 1 son(s) , 1 daughter(s) .        No Family History of Colon Cancer, Polyps, or Liver Disease.     Social History:      General         Tobacco use cigarettes:  Former smoker, Quit in year   Quit 12/31/2021- [facility], Tobacco history last updated  04/10/2022.           Alcohol: no.          Caffeine: no.          Recreational drug use: no.          Exercise: no.          Marital Status: single.          OCCUPATION: employed, Marine scientist.    ROS:      CONSTITUTIONAL         Chills  No.  Fatigue  No.  Fever  No.  Night sweats  No.  Recent travel outside [place]  No.  Sweats  No.  Weight change  No.       OPHTHALMOLOGY         Blurring of vision  no.  Change in vision  no.  Double vision  no.       ENT         Dizziness  no.  Nose bleeds  no.  Sore throat  no.  Teeth pain  no.       ALLERGY         Hives  no.       CARDIOLOGY         Chest pain  no.  High blood pressure  no.  Irregular heart beat  no.  Leg edema  no.  Palpitations  no.       RESPIRATORY          Shortness of breath  no.  Cough  no.  Wheezing  no.       UROLOGY         Pain with urination  no.  Urinary urgency  no.  Urinary frequency  no.  Urinary incontinence  no.  Difficulty urinating  No.  Blood in urine  No.       GASTROENTEROLOGY         Abdominal pain  no.  Appetite change  no.  Bloating/belching  no.  Blood in stool or on toilet paper  no.  Change in bowel movements  no.  Constipation  no.  Diarrhea  no.  Difficulty swallowing  no.  Nausea  no.       FEMALE REPRODUCTIVE         Vulvar pain  no.  Vulvar rash  no.  Abnormal vaginal bleeding  heavy menses.  Breast pain  no.  Nipple discharge  no.  Pain with intercourse  no.  Pelvic pain  worse with menstruation.  Unusual vaginal discharge  no.  Vaginal itching  no.       MUSCULOSKELETAL         Muscle aches  no.       NEUROLOGY         Headache  no.  Tingling/numbness  no.  Weakness  no.       PSYCHOLOGY         Depression  no.  Anxiety  no.  Nervousness  no.  Sleep disturbances  no.  Suicidal ideation  no .  ENDOCRINOLOGY         Excessive thirst  no.  Excessive urination  no.  Hair loss  no.  Heat or cold intolerance  no.       HEMATOLOGY/LYMPH         Abnormal bleeding  no.  Easy bruising  no.  Swollen glands  no.       DERMATOLOGY         New/changing skin lesion  no.  Rash  no.  Sores  no.         Negative except as stated in HPI.  Objective:    Vitals:        Wt: 227.6, Wt change: -0.6 lbs, Ht: 67, BMI: 35.64, Pulse sitting: 69, BP sitting: 136/89.    Past Results:    Examination:      General Examination         CONSTITUTIONAL: alert, oriented, NAD.          SKIN:  moist, warm.          EYES:  Conjunctiva clear.          LUNGS:  good I:E efffort noted, clear to auscultation bilaterally.          HEART:  regular rate and rhythm.          ABDOMEN: soft, non-tender/non-distended, bowel sounds present.          FEMALE GENITOURINARY: normal external genitalia, labia - unremarkable, vagina - pink moist mucosa, no  lesions or abnormal discharge, cervix - no discharge or lesions or CMT, adnexa - no masses or tenderness, uterus - nontender and normal size on palpation.          EXTREMITIES:  no edema present.          PSYCH:  affect normal, good eye contact.     Physical Examination:      Chaperone present          Chaperone present Beather Arbour 04/10/2022 09:21:38 AM > , for pelvic exam.    Assessment:    Assessment:    Menorrhagia with regular cycle - N92.0 (Primary)    Dysmenorrhea - N94.6   Plan:    Treatment:     Menorrhagia with regular cycle          Notes: Notes: r/b/a of robotic assisted laparoscopic hysterectomy with bilateral salpingectomy discussed with the patient. including but not limited to infection/ bleeding / damage to bowel bladder ureters as well as conversion to laparotomy with the need for further surgery. pt voiced understanding and would like to proceed with robotic assisted laparoscopic hysterectomy with bilateral salpingectomy.. r/o trasnfusion discussed. HIV/ HEP B and C.. she is advised to avoid NSAIDS between now and surgery. she is advised not to eat or drink after midninght the night prior to surgery.Marland Kitchen she will not be able to drive for 1 week after surgery. she will need to avoid heavy lifting over 10 lbs and sex for at least 6 weeks after surgery.     Dysmenorrhea          Notes: Notes: r/b/a of robotic assisted laparoscopic hysterectomy with bilateral salpingectomy discussed with the patient. including but not limited to infection/ bleeding / damage to bowel bladder ureters as well as conversion to laparotomy with the need for further surgery. pt voiced understanding and would like to proceed with robotic assisted laparoscopic hysterectomy with bilateral salpingectomy.. r/o trasnfusion discussed. HIV/ HEP B and C.. she is advised to avoid NSAIDS between  now and surgery. she is advised not to eat or drink after midninght the night prior to surgery.Marland Kitchen she will not be able to  drive for 1 week after surgery. she will need to avoid heavy lifting over 10 lbs and sex for at least 6 weeks after surgery.

## 2022-04-21 NOTE — H&P (Signed)
  The note originally documented on this encounter has been moved the the encounter in which it belongs.  

## 2022-04-22 ENCOUNTER — Observation Stay (HOSPITAL_BASED_OUTPATIENT_CLINIC_OR_DEPARTMENT_OTHER): Payer: 59 | Admitting: Anesthesiology

## 2022-04-22 ENCOUNTER — Observation Stay (HOSPITAL_BASED_OUTPATIENT_CLINIC_OR_DEPARTMENT_OTHER)
Admission: RE | Admit: 2022-04-22 | Discharge: 2022-04-23 | Disposition: A | Discharge status: Home / Self Care | Payer: 59 | Attending: Obstetrics and Gynecology | Admitting: Obstetrics and Gynecology

## 2022-04-22 ENCOUNTER — Other Ambulatory Visit: Payer: Self-pay

## 2022-04-22 ENCOUNTER — Encounter (HOSPITAL_BASED_OUTPATIENT_CLINIC_OR_DEPARTMENT_OTHER)
Admission: RE | Disposition: A | Discharge status: Home / Self Care | Payer: Self-pay | Source: Home / Self Care | Attending: Obstetrics and Gynecology

## 2022-04-22 ENCOUNTER — Encounter (HOSPITAL_BASED_OUTPATIENT_CLINIC_OR_DEPARTMENT_OTHER): Payer: Self-pay | Admitting: Obstetrics and Gynecology

## 2022-04-22 DIAGNOSIS — N920 Excessive and frequent menstruation with regular cycle: Principal | ICD-10-CM | POA: Insufficient documentation

## 2022-04-22 DIAGNOSIS — F418 Other specified anxiety disorders: Secondary | ICD-10-CM | POA: Diagnosis not present

## 2022-04-22 DIAGNOSIS — Z90710 Acquired absence of both cervix and uterus: Secondary | ICD-10-CM | POA: Diagnosis present

## 2022-04-22 DIAGNOSIS — D251 Intramural leiomyoma of uterus: Secondary | ICD-10-CM | POA: Diagnosis not present

## 2022-04-22 DIAGNOSIS — N8003 Adenomyosis of the uterus: Secondary | ICD-10-CM | POA: Diagnosis not present

## 2022-04-22 DIAGNOSIS — Z6841 Body Mass Index (BMI) 40.0 and over, adult: Secondary | ICD-10-CM

## 2022-04-22 DIAGNOSIS — Z87891 Personal history of nicotine dependence: Secondary | ICD-10-CM | POA: Insufficient documentation

## 2022-04-22 DIAGNOSIS — D259 Leiomyoma of uterus, unspecified: Secondary | ICD-10-CM | POA: Insufficient documentation

## 2022-04-22 DIAGNOSIS — N946 Dysmenorrhea, unspecified: Secondary | ICD-10-CM | POA: Diagnosis not present

## 2022-04-22 DIAGNOSIS — Z01818 Encounter for other preprocedural examination: Secondary | ICD-10-CM

## 2022-04-22 HISTORY — DX: Mixed hyperlipidemia: E78.2

## 2022-04-22 HISTORY — DX: Anxiety disorder, unspecified: F41.9

## 2022-04-22 HISTORY — PX: ROBOTIC ASSISTED TOTAL HYSTERECTOMY WITH BILATERAL SALPINGO OOPHERECTOMY: SHX6086

## 2022-04-22 HISTORY — DX: Obesity, unspecified: E66.9

## 2022-04-22 HISTORY — PX: ABDOMINAL HYSTERECTOMY: SHX81

## 2022-04-22 HISTORY — DX: Dysmenorrhea, unspecified: N94.6

## 2022-04-22 HISTORY — DX: Headache, unspecified: R51.9

## 2022-04-22 HISTORY — DX: Depression, unspecified: F32.A

## 2022-04-22 LAB — POCT PREGNANCY, URINE: Preg Test, Ur: NEGATIVE

## 2022-04-22 LAB — TYPE AND SCREEN
ABO/RH(D): O POS
Antibody Screen: NEGATIVE

## 2022-04-22 LAB — ABO/RH: ABO/RH(D): O POS

## 2022-04-22 SURGERY — HYSTERECTOMY, TOTAL, ROBOT-ASSISTED, LAPAROSCOPIC, WITH BILATERAL SALPINGO-OOPHORECTOMY
Anesthesia: General | Site: Pelvis

## 2022-04-22 MED ORDER — OXYCODONE HCL 5 MG PO TABS
ORAL_TABLET | ORAL | Status: AC
  Filled 2022-04-22: qty 1

## 2022-04-22 MED ORDER — LACTATED RINGERS IV SOLN: INTRAVENOUS | Status: DC

## 2022-04-22 MED ORDER — SENNA 8.6 MG PO TABS
ORAL_TABLET | ORAL | Status: AC
  Filled 2022-04-22: qty 1

## 2022-04-22 MED ORDER — SIMETHICONE 80 MG PO CHEW: 80.0000 mg | CHEWABLE_TABLET | Freq: Four times a day (QID) | ORAL | Status: DC | PRN

## 2022-04-22 MED ORDER — DEXAMETHASONE SODIUM PHOSPHATE 10 MG/ML IJ SOLN
INTRAMUSCULAR | Status: DC | PRN
  Administered 2022-04-22: 5 mg via INTRAVENOUS

## 2022-04-22 MED ORDER — STERILE WATER FOR IRRIGATION IR SOLN
Status: DC | PRN
  Administered 2022-04-22: 500 mL

## 2022-04-22 MED ORDER — IBUPROFEN 200 MG PO TABS
600.0000 mg | ORAL_TABLET | Freq: Four times a day (QID) | ORAL | Status: DC
  Administered 2022-04-22 – 2022-04-23 (×3): 600 mg via ORAL

## 2022-04-22 MED ORDER — ACETAMINOPHEN 500 MG PO TABS: 1000.0000 mg | ORAL_TABLET | Freq: Three times a day (TID) | ORAL | 0 refills | Status: AC | PRN

## 2022-04-22 MED ORDER — LIDOCAINE HCL (PF) 2 % IJ SOLN
INTRAMUSCULAR | Status: AC
  Filled 2022-04-22: qty 5

## 2022-04-22 MED ORDER — KETOROLAC TROMETHAMINE 30 MG/ML IJ SOLN
INTRAMUSCULAR | Status: DC | PRN
  Administered 2022-04-22: 30 mg via INTRAVENOUS

## 2022-04-22 MED ORDER — PROPOFOL 10 MG/ML IV BOLUS
INTRAVENOUS | Status: DC | PRN
  Administered 2022-04-22: 260 mg via INTRAVENOUS

## 2022-04-22 MED ORDER — OXYCODONE HCL 5 MG/5ML PO SOLN: 5.0000 mg | Freq: Once | ORAL | Status: AC | PRN

## 2022-04-22 MED ORDER — SODIUM CHLORIDE 0.9 % IV SOLN
INTRAVENOUS | Status: AC
  Filled 2022-04-22: qty 2

## 2022-04-22 MED ORDER — HYDROMORPHONE HCL 2 MG/ML IJ SOLN
INTRAMUSCULAR | Status: AC
  Filled 2022-04-22: qty 1

## 2022-04-22 MED ORDER — ZOLPIDEM TARTRATE 5 MG PO TABS: 5.0000 mg | ORAL_TABLET | Freq: Every evening | ORAL | Status: DC | PRN

## 2022-04-22 MED ORDER — GABAPENTIN 300 MG PO CAPS
ORAL_CAPSULE | ORAL | Status: AC
  Filled 2022-04-22: qty 1

## 2022-04-22 MED ORDER — SCOPOLAMINE 1 MG/3DAYS TD PT72
1.0000 | MEDICATED_PATCH | TRANSDERMAL | Status: DC
  Administered 2022-04-22: 1.5 mg via TRANSDERMAL

## 2022-04-22 MED ORDER — SUGAMMADEX SODIUM 200 MG/2ML IV SOLN
INTRAVENOUS | Status: DC | PRN
  Administered 2022-04-22: 200 mg via INTRAVENOUS

## 2022-04-22 MED ORDER — ACETAMINOPHEN 500 MG PO TABS
1000.0000 mg | ORAL_TABLET | ORAL | Status: AC
  Administered 2022-04-22: 1000 mg via ORAL

## 2022-04-22 MED ORDER — DEXAMETHASONE SODIUM PHOSPHATE 10 MG/ML IJ SOLN
INTRAMUSCULAR | Status: AC
  Filled 2022-04-22: qty 1

## 2022-04-22 MED ORDER — MIDAZOLAM HCL 2 MG/2ML IJ SOLN
INTRAMUSCULAR | Status: AC
  Filled 2022-04-22: qty 2

## 2022-04-22 MED ORDER — GABAPENTIN 300 MG PO CAPS
300.0000 mg | ORAL_CAPSULE | ORAL | Status: AC
  Administered 2022-04-22: 300 mg via ORAL

## 2022-04-22 MED ORDER — PANTOPRAZOLE SODIUM 40 MG PO TBEC
DELAYED_RELEASE_TABLET | ORAL | Status: AC
  Filled 2022-04-22: qty 1

## 2022-04-22 MED ORDER — MIDAZOLAM HCL 2 MG/2ML IJ SOLN
INTRAMUSCULAR | Status: DC | PRN
  Administered 2022-04-22: 2 mg via INTRAVENOUS

## 2022-04-22 MED ORDER — PANTOPRAZOLE SODIUM 40 MG PO TBEC
40.0000 mg | DELAYED_RELEASE_TABLET | Freq: Every day | ORAL | Status: DC
  Administered 2022-04-22: 40 mg via ORAL

## 2022-04-22 MED ORDER — ROCURONIUM BROMIDE 10 MG/ML (PF) SYRINGE
PREFILLED_SYRINGE | INTRAVENOUS | Status: DC | PRN
  Administered 2022-04-22: 100 mg via INTRAVENOUS

## 2022-04-22 MED ORDER — IBUPROFEN 600 MG PO TABS: 600.0000 mg | ORAL_TABLET | Freq: Four times a day (QID) | ORAL | 1 refills | Status: DC | PRN

## 2022-04-22 MED ORDER — SCOPOLAMINE 1 MG/3DAYS TD PT72
MEDICATED_PATCH | TRANSDERMAL | Status: AC
  Filled 2022-04-22: qty 1

## 2022-04-22 MED ORDER — HYDROMORPHONE HCL 1 MG/ML IJ SOLN
INTRAMUSCULAR | Status: AC
  Filled 2022-04-22: qty 1

## 2022-04-22 MED ORDER — DEXMEDETOMIDINE HCL IN NACL 400 MCG/100ML IV SOLN
INTRAVENOUS | Status: DC | PRN
  Administered 2022-04-22: 4 ug via INTRAVENOUS
  Administered 2022-04-22 (×2): 8 ug via INTRAVENOUS

## 2022-04-22 MED ORDER — ONDANSETRON HCL 4 MG PO TABS: 4.0000 mg | ORAL_TABLET | Freq: Four times a day (QID) | ORAL | Status: DC | PRN

## 2022-04-22 MED ORDER — LIDOCAINE 2% (20 MG/ML) 5 ML SYRINGE
INTRAMUSCULAR | Status: DC | PRN
  Administered 2022-04-22: 100 mg via INTRAVENOUS

## 2022-04-22 MED ORDER — ROCURONIUM BROMIDE 10 MG/ML (PF) SYRINGE
PREFILLED_SYRINGE | INTRAVENOUS | Status: AC
  Filled 2022-04-22: qty 10

## 2022-04-22 MED ORDER — SODIUM CHLORIDE 0.9 % IV SOLN
2.0000 g | INTRAVENOUS | Status: AC
  Administered 2022-04-22: 2 g via INTRAVENOUS

## 2022-04-22 MED ORDER — ACETAMINOPHEN 500 MG PO TABS
1000.0000 mg | ORAL_TABLET | Freq: Four times a day (QID) | ORAL | Status: DC
  Administered 2022-04-22 (×2): 1000 mg via ORAL

## 2022-04-22 MED ORDER — OXYCODONE HCL 5 MG PO TABS: 5.0000 mg | ORAL_TABLET | ORAL | 0 refills | Status: DC | PRN

## 2022-04-22 MED ORDER — OXYCODONE HCL 5 MG PO TABS
5.0000 mg | ORAL_TABLET | Freq: Once | ORAL | Status: AC | PRN
  Administered 2022-04-22: 5 mg via ORAL

## 2022-04-22 MED ORDER — HYDROMORPHONE HCL 1 MG/ML IJ SOLN
0.2500 mg | INTRAMUSCULAR | Status: DC | PRN
  Administered 2022-04-22: 0.25 mg via INTRAVENOUS

## 2022-04-22 MED ORDER — FENTANYL CITRATE (PF) 250 MCG/5ML IJ SOLN
INTRAMUSCULAR | Status: AC
  Filled 2022-04-22: qty 5

## 2022-04-22 MED ORDER — ONDANSETRON HCL 4 MG/2ML IJ SOLN
4.0000 mg | Freq: Once | INTRAMUSCULAR | Status: AC | PRN
  Administered 2022-04-22: 4 mg via INTRAVENOUS

## 2022-04-22 MED ORDER — ONDANSETRON HCL 4 MG/2ML IJ SOLN
INTRAMUSCULAR | Status: DC | PRN
  Administered 2022-04-22: 4 mg via INTRAVENOUS

## 2022-04-22 MED ORDER — MENTHOL 3 MG MT LOZG
1.0000 | LOZENGE | OROMUCOSAL | Status: DC | PRN
  Administered 2022-04-22: 3 mg via ORAL

## 2022-04-22 MED ORDER — SODIUM CHLORIDE 0.9 % IV SOLN
INTRAVENOUS | Status: DC | PRN
  Administered 2022-04-22: 114 mL

## 2022-04-22 MED ORDER — KETOROLAC TROMETHAMINE 30 MG/ML IJ SOLN
INTRAMUSCULAR | Status: AC
  Filled 2022-04-22: qty 1

## 2022-04-22 MED ORDER — HEMOSTATIC AGENTS (NO CHARGE) OPTIME
TOPICAL | Status: DC | PRN
  Administered 2022-04-22: 1 via TOPICAL

## 2022-04-22 MED ORDER — HYDROMORPHONE HCL 1 MG/ML IJ SOLN
INTRAMUSCULAR | Status: DC | PRN
  Administered 2022-04-22: .5 mg via INTRAVENOUS

## 2022-04-22 MED ORDER — ACETAMINOPHEN 500 MG PO TABS
ORAL_TABLET | ORAL | Status: AC
  Filled 2022-04-22: qty 2

## 2022-04-22 MED ORDER — POVIDONE-IODINE 10 % EX SWAB: 2.0000 | Freq: Once | CUTANEOUS | Status: DC

## 2022-04-22 MED ORDER — PROPOFOL 10 MG/ML IV BOLUS
INTRAVENOUS | Status: AC
  Filled 2022-04-22: qty 20

## 2022-04-22 MED ORDER — DEXMEDETOMIDINE HCL IN NACL 80 MCG/20ML IV SOLN: INTRAVENOUS | Status: DC | PRN

## 2022-04-22 MED ORDER — DEXMEDETOMIDINE HCL IN NACL 80 MCG/20ML IV SOLN
INTRAVENOUS | Status: AC
  Filled 2022-04-22: qty 20

## 2022-04-22 MED ORDER — IBUPROFEN 200 MG PO TABS
ORAL_TABLET | ORAL | Status: AC
  Filled 2022-04-22: qty 3

## 2022-04-22 MED ORDER — FENTANYL CITRATE (PF) 100 MCG/2ML IJ SOLN
INTRAMUSCULAR | Status: DC | PRN
  Administered 2022-04-22: 100 ug via INTRAVENOUS
  Administered 2022-04-22: 50 ug via INTRAVENOUS
  Administered 2022-04-22: 100 ug via INTRAVENOUS

## 2022-04-22 MED ORDER — SODIUM CHLORIDE 0.9 % IR SOLN
Status: DC | PRN
  Administered 2022-04-22: 1000 mL

## 2022-04-22 MED ORDER — ALUM & MAG HYDROXIDE-SIMETH 200-200-20 MG/5ML PO SUSP: 30.0000 mL | ORAL | Status: DC | PRN

## 2022-04-22 MED ORDER — SENNA 8.6 MG PO TABS
1.0000 | ORAL_TABLET | Freq: Two times a day (BID) | ORAL | Status: DC
  Administered 2022-04-22 (×2): 8.6 mg via ORAL

## 2022-04-22 MED ORDER — ONDANSETRON HCL 4 MG/2ML IJ SOLN
INTRAMUSCULAR | Status: AC
  Filled 2022-04-22: qty 2

## 2022-04-22 MED ORDER — ONDANSETRON HCL 4 MG/2ML IJ SOLN: 4.0000 mg | Freq: Four times a day (QID) | INTRAMUSCULAR | Status: DC | PRN

## 2022-04-22 MED ORDER — HYDROMORPHONE HCL 1 MG/ML IJ SOLN: 0.2000 mg | INTRAMUSCULAR | Status: DC | PRN

## 2022-04-22 MED ORDER — OXYCODONE HCL 5 MG PO TABS
5.0000 mg | ORAL_TABLET | ORAL | Status: DC | PRN
  Administered 2022-04-22 – 2022-04-23 (×3): 5 mg via ORAL
  Administered 2022-04-23: 10 mg via ORAL

## 2022-04-22 MED ORDER — MENTHOL 3 MG MT LOZG
LOZENGE | OROMUCOSAL | Status: AC
  Filled 2022-04-22: qty 9

## 2022-04-22 SURGICAL SUPPLY — 74 items
ADH SKN CLS APL DERMABOND .7 (GAUZE/BANDAGES/DRESSINGS) ×1
APL SRG 38 LTWT LNG FL B (MISCELLANEOUS) ×1
APPLICATOR ARISTA FLEXITIP XL (MISCELLANEOUS) IMPLANT
BARRIER ADHS 3X4 INTERCEED (GAUZE/BANDAGES/DRESSINGS) IMPLANT
BRR ADH 4X3 ABS CNTRL BYND (GAUZE/BANDAGES/DRESSINGS)
CATH FOLEY 3WAY  5CC 16FR (CATHETERS) ×1
CATH FOLEY 3WAY 5CC 16FR (CATHETERS) ×1 IMPLANT
CELLS DAT CNTRL 66122 CELL SVR (MISCELLANEOUS) IMPLANT
COVER BACK TABLE 60X90IN (DRAPES) ×1 IMPLANT
COVER TIP SHEARS 8 DVNC (MISCELLANEOUS) ×1 IMPLANT
COVER TIP SHEARS 8MM DA VINCI (MISCELLANEOUS) ×1
DEFOGGER SCOPE WARMER CLEARIFY (MISCELLANEOUS) ×1 IMPLANT
DERMABOND ADVANCED .7 DNX12 (GAUZE/BANDAGES/DRESSINGS) ×1 IMPLANT
DILATOR CANAL MILEX (MISCELLANEOUS) ×1 IMPLANT
DRAPE ARM DVNC X/XI (DISPOSABLE) ×4 IMPLANT
DRAPE COLUMN DVNC XI (DISPOSABLE) ×1 IMPLANT
DRAPE DA VINCI XI ARM (DISPOSABLE) ×4
DRAPE DA VINCI XI COLUMN (DISPOSABLE) ×1
DRAPE UTILITY 15X26 TOWEL STRL (DRAPES) ×1 IMPLANT
DRAPE UTILITY XL STRL (DRAPES) IMPLANT
DURAPREP 26ML APPLICATOR (WOUND CARE) ×1 IMPLANT
ELECT REM PT RETURN 9FT ADLT (ELECTROSURGICAL) ×1
ELECTRODE REM PT RTRN 9FT ADLT (ELECTROSURGICAL) ×1 IMPLANT
GAUZE 4X4 16PLY ~~LOC~~+RFID DBL (SPONGE) ×2 IMPLANT
GLOVE BIOGEL M 6.5 STRL (GLOVE) ×3 IMPLANT
GLOVE BIOGEL M 7.0 STRL (GLOVE) ×3 IMPLANT
GLOVE BIOGEL PI IND STRL 6.5 (GLOVE) ×6 IMPLANT
GLOVE BIOGEL PI IND STRL 7.0 (GLOVE) ×6 IMPLANT
HEMOSTAT ARISTA ABSORB 3G PWDR (HEMOSTASIS) IMPLANT
HOLDER FOLEY CATH W/STRAP (MISCELLANEOUS) IMPLANT
IRRIG SUCT STRYKERFLOW 2 WTIP (MISCELLANEOUS) ×1
IRRIGATION SUCT STRKRFLW 2 WTP (MISCELLANEOUS) ×1 IMPLANT
KIT TURNOVER CYSTO (KITS) ×1 IMPLANT
LEGGING LITHOTOMY PAIR STRL (DRAPES) ×1 IMPLANT
MANIFOLD NEPTUNE II (INSTRUMENTS) ×1 IMPLANT
NEEDLE HYPO 22GX1.5 SAFETY (NEEDLE) IMPLANT
OBTURATOR OPTICAL STANDARD 8MM (TROCAR) ×1
OBTURATOR OPTICAL STND 8 DVNC (TROCAR) ×1
OBTURATOR OPTICALSTD 8 DVNC (TROCAR) ×1 IMPLANT
OCCLUDER COLPOPNEUMO (BALLOONS) ×1 IMPLANT
PACK ROBOT WH (CUSTOM PROCEDURE TRAY) ×1 IMPLANT
PACK ROBOTIC GOWN (GOWN DISPOSABLE) ×1 IMPLANT
PACK TRENDGUARD 450 HYBRID PRO (MISCELLANEOUS) IMPLANT
PAD OB MATERNITY 4.3X12.25 (PERSONAL CARE ITEMS) ×1 IMPLANT
PAD PREP 24X48 CUFFED NSTRL (MISCELLANEOUS) ×1 IMPLANT
PROTECTOR NERVE ULNAR (MISCELLANEOUS) ×1 IMPLANT
RETRACTOR WND ALEXIS 18 MED (MISCELLANEOUS) IMPLANT
RTRCTR WOUND ALEXIS 18CM MED (MISCELLANEOUS)
RTRCTR WOUND ALEXIS 18CM SML (INSTRUMENTS)
SAVER CELL AAL HAEMONETICS (INSTRUMENTS) IMPLANT
SCISSORS LAP 5X45 EPIX DISP (ENDOMECHANICALS) IMPLANT
SEAL CANN UNIV 5-8 DVNC XI (MISCELLANEOUS) ×4 IMPLANT
SEAL XI 5MM-8MM UNIVERSAL (MISCELLANEOUS) ×4
SEALER VESSEL DA VINCI XI (MISCELLANEOUS) ×1
SEALER VESSEL EXT DVNC XI (MISCELLANEOUS) IMPLANT
SET IRRIG Y TYPE TUR BLADDER L (SET/KITS/TRAYS/PACK) IMPLANT
SET TRI-LUMEN FLTR TB AIRSEAL (TUBING) ×1 IMPLANT
SPIKE FLUID TRANSFER (MISCELLANEOUS) ×2 IMPLANT
SPONGE T-LAP 4X18 ~~LOC~~+RFID (SPONGE) ×1 IMPLANT
SUT VIC AB 0 CT1 27 (SUTURE) ×2
SUT VIC AB 0 CT1 27XBRD ANBCTR (SUTURE) ×2 IMPLANT
SUT VICRYL 0 UR6 27IN ABS (SUTURE) IMPLANT
SUT VICRYL RAPIDE 4/0 PS 2 (SUTURE) ×3 IMPLANT
SUT VLOC 180 0 9IN  GS21 (SUTURE) ×1
SUT VLOC 180 0 9IN GS21 (SUTURE) ×1 IMPLANT
TIP RUMI ORANGE 6.7MMX12CM (TIP) IMPLANT
TIP UTERINE 5.1X6CM LAV DISP (MISCELLANEOUS) IMPLANT
TIP UTERINE 6.7X10CM GRN DISP (MISCELLANEOUS) IMPLANT
TIP UTERINE 6.7X6CM WHT DISP (MISCELLANEOUS) IMPLANT
TIP UTERINE 6.7X8CM BLUE DISP (MISCELLANEOUS) IMPLANT
TOWEL OR 17X26 10 PK STRL BLUE (TOWEL DISPOSABLE) ×1 IMPLANT
TRENDGUARD 450 HYBRID PRO PACK (MISCELLANEOUS)
TROCAR PORT AIRSEAL 8X120 (TROCAR) ×1 IMPLANT
WATER STERILE IRR 1000ML POUR (IV SOLUTION) ×1 IMPLANT

## 2022-04-22 NOTE — Anesthesia Procedure Notes (Signed)
Procedure Name: Intubation Date/Time: 04/22/2022 9:48 AM  Performed by: Mechele Claude, CRNAPre-anesthesia Checklist: Patient identified, Emergency Drugs available, Suction available and Patient being monitored Patient Re-evaluated:Patient Re-evaluated prior to induction Oxygen Delivery Method: Circle system utilized Preoxygenation: Pre-oxygenation with 100% oxygen Induction Type: IV induction and Cricoid Pressure applied Ventilation: Mask ventilation without difficulty Laryngoscope Size: Mac and 3 Grade View: Grade II Tube type: Oral Tube size: 7.0 mm Number of attempts: 1 Airway Equipment and Method: Stylet and Oral airway Placement Confirmation: ETT inserted through vocal cords under direct vision, positive ETCO2 and breath sounds checked- equal and bilateral Secured at: 21 cm Tube secured with: Tape Dental Injury: Teeth and Oropharynx as per pre-operative assessment

## 2022-04-22 NOTE — Op Note (Signed)
04/22/2022  3:36 PM  PATIENT:  Caitlin Foster  41 y.o. female  PRE-OPERATIVE DIAGNOSIS:  Menorrhagia DYSMENORHEA  POST-OPERATIVE DIAGNOSIS:  Menorrhagia DYSMENORHEA  PROCEDURE:  Procedure(s): XI ROBOTIC ASSISTED TOTAL HYSTERECTOMY WITH BILATERAL SALPINGectomy (N/A)  SURGEON:  Surgeon(s) and Role:    Christophe Louis, MD - Primary    * Drema Dallas, DO - Assisting  PHYSICIAN ASSISTANT:   ASSISTANTS: Dr. Drema Dallas assisted due to complexity of the anatomy    ANESTHESIA:   general  EBL:  25 mL   BLOOD ADMINISTERED:none  DRAINS: Urinary Catheter (Foley)   LOCAL MEDICATIONS USED:  OTHER ropivicaine   SPECIMEN:  Source of Specimen:  uterus cervix and bilateral fallopian tubes   DISPOSITION OF SPECIMEN:  PATHOLOGY  COUNTS:  YES  TOURNIQUET:  * No tourniquets in log *  DICTATION: .Note written in EPIC  PLAN OF CARE: Admit for overnight observation  PATIENT DISPOSITION:  PACU - hemodynamically stable.   Delay start of Pharmacological VTE agent (>24hrs) due to surgical blood loss or risk of bleeding: not applicable  Findings: slightyly enlarged fibroid uterus. Normal appearing fallopian tubes and ovaries.   Procedure: The patient was taken to the operating room where she was placed under general anesthesia.Time out was performed. Marland Kitchen She was placed in dorsal lithotomy position and prepped and draped in the usual sterile fashion. A weighted speculum was placed into the vagina. A Deaver was placed anteriorly for retraction. The anterior lip of the cervix was grasped with a single-tooth tenaculum. The vaginal mucosa was injected with 2.5 cc of ropivacaine at the 2/4/ 8 and 10 o'clock positions. The uterus was sounded to 8 cm. the cervix was dilated to 6 mm . 0 vicryl suture placed at the 12 and 6:00 positions Of the cervix to facilitate placement of a Ru mi uterine manipulator. The manipulator was placed without difficulty. Weighted speculum and Deaver were removed .   Attention was turned to the patient's abdomen where a 8 mm trocar was placed at  the umbilicus. under direct visualization . The pneumoperitoneum was achieved with PCO2 gas. The laparoscope was removed. 60 cc of ropivacaine were injected into the abdominal cavity. The laparoscope was reinserted. An 8 mm trocar was placed in the right upper quadrant 16 centimeters from the umbilicus.later connected to robotic arm #4). An 8MM incision was made in the Right upper quadrant TROCAR WAS PLACED 8 cm from the umbilicus. Later connected to robotic arm #3. An 8 mm incision was made in the left upper quadrant 16 cm from the umbilicus and connected to robot arm #1. Marland Kitchen Attention was turned to the left upper quadrant where a 8 mm midclavicular assistant trocar was placed. ( All incision sites were injected with 10cc of ropivacaine prior to port placement. )  Once all ports had been placed under direct visualization.The laparoscope was removed and the [place] robotic system was thin right-sided docked. The robotic arms were connected to the corresponding trocars as listed above. The laparoscope was then reinserted. The long tip bipolar forceps were placed into port #1. The prograsp was  placed in the port #4. A vessel sealer ( alternating with scissors) was placed in port #3. All instruments were directed into the pelvis under direct visualization.  Attention was turned to the surgeons console.. The left mesosalpinx and left utero-ovarian ligament was cauterized and transected with the vessel sealer The broad ligament was cauterized and transected with the vessel sealer .The round ligament was cauterized and transected  with the vessel sealer  The anterior leaf of broad ligament was incised along the bladder reflection to the midline.  The right  mesosalpinx and right utero-ovarian ligament were cauterized and transected with the vessel sealer. The right broad ligament was cauterized and transected with the vessel sealer. The  right round ligament was cauterized and transected with the vessel sealer The broad ligament was incised to the midline. The bladder was dissected off the lower uterine segments of the cervix via sharp and blunt dissection.   The uterine arteries were skeleton bilaterally. They were  cauterized and transected with the vessel sealer The KOH ring was identified. The anterior colpotomy was performed followed by the posterior colpotomy. Once the uterus,cervix and bilateral fallopian tubes were completely excised was removed through the vagina. The  bipolar forceps and scissors were removed and cobra forceps were placed in the port #1 and the  mega needle driver was placed in to port #3.  The vaginal cuff was closed with running suture if 0 v-lock. The pelvis was irrigated. Marland KitchenMarland KitchenMarland KitchenExcellent hemostasis was noted. Arista was placed along the vaginal cuff.  All pelvic pedicles were examined and hemostasis was noted.  All instruments removed from the ports. All ports were removed under direct Visualization. The pneumoperitoneum was released. The skin incisions were closed with 4-0 Vicryl and then covered with Derma bond.     Sponge lap and needle counts were correct x2. The patient was awakened from anesthesia and taken to the recovery room in stable condition.

## 2022-04-22 NOTE — Addendum Note (Signed)
Addendum  created 04/22/22 1502 by Mechele Claude, CRNA   Intraprocedure Meds edited

## 2022-04-22 NOTE — Anesthesia Postprocedure Evaluation (Signed)
Anesthesia Post Note  Patient: Caitlin Foster  Procedure(s) Performed: XI ROBOTIC ASSISTED TOTAL HYSTERECTOMY WITH BILATERAL SALPINGectomy (Pelvis)     Patient location during evaluation: PACU Anesthesia Type: General Level of consciousness: awake and alert and oriented Pain management: pain level controlled Vital Signs Assessment: post-procedure vital signs reviewed and stable Respiratory status: spontaneous breathing, nonlabored ventilation and respiratory function stable Cardiovascular status: blood pressure returned to baseline and stable Postop Assessment: no apparent nausea or vomiting Anesthetic complications: no   No notable events documented.  Last Vitals:  Vitals:   04/22/22 1245 04/22/22 1300  BP: (!) 140/83 (!) 141/84  Pulse: 65 61  Resp: 14 18  Temp:    SpO2: 95% 96%    Last Pain:  Vitals:   04/22/22 1300  TempSrc:   PainSc: 4                  Sheridan Hew A.

## 2022-04-22 NOTE — Anesthesia Preprocedure Evaluation (Addendum)
Anesthesia Evaluation  Patient identified by MRN, date of birth, ID band Patient awake    Reviewed: Allergy & Precautions, NPO status , Patient's Chart, lab work & pertinent test results  Airway Mallampati: IV  TM Distance: >3 FB Neck ROM: Full  Mouth opening: Limited Mouth Opening  Dental no notable dental hx. (+) Teeth Intact   Pulmonary former smoker,    Pulmonary exam normal breath sounds clear to auscultation       Cardiovascular negative cardio ROS Normal cardiovascular exam Rhythm:Regular Rate:Normal     Neuro/Psych  Headaches, PSYCHIATRIC DISORDERS Anxiety Depression    GI/Hepatic negative GI ROS, Neg liver ROS,   Endo/Other  Morbid obesityGLP-1 agonist therapy Hyperlipidemia  Renal/GU negative Renal ROS  negative genitourinary   Musculoskeletal negative musculoskeletal ROS (+)   Abdominal (+) + obese,   Peds  Hematology   Anesthesia Other Findings   Reproductive/Obstetrics Menorrhagia Dysmenorrhea                            Anesthesia Physical Anesthesia Plan  ASA: 3  Anesthesia Plan: General   Post-op Pain Management: Minimal or no pain anticipated, Tylenol PO (pre-op)*, Dilaudid IV and Precedex   Induction: Intravenous  PONV Risk Score and Plan: 4 or greater and Treatment may vary due to age or medical condition, Scopolamine patch - Pre-op, Midazolam, Ondansetron and Dexamethasone  Airway Management Planned: Oral ETT  Additional Equipment: None  Intra-op Plan:   Post-operative Plan: Extubation in OR  Informed Consent: I have reviewed the patients History and Physical, chart, labs and discussed the procedure including the risks, benefits and alternatives for the proposed anesthesia with the patient or authorized representative who has indicated his/her understanding and acceptance.     Dental advisory given  Plan Discussed with: CRNA and  Anesthesiologist  Anesthesia Plan Comments:         Anesthesia Quick Evaluation

## 2022-04-22 NOTE — H&P (Signed)
Date of Initial H&P: 04/21/2022  History reviewed, patient examined, no change in status, stable for surgery.

## 2022-04-22 NOTE — Transfer of Care (Signed)
Immediate Anesthesia Transfer of Care Note  Patient: Caitlin Foster  Procedure(s) Performed: Procedure(s) (LRB): XI ROBOTIC ASSISTED TOTAL HYSTERECTOMY WITH BILATERAL SALPINGO OOPHORECTOMY (N/A)  Patient Location: PACU  Anesthesia Type: General  Level of Consciousness: sleepy  Airway & Oxygen Therapy: Patient Spontanous Breathing and Patient connected to nasal cannula oxygen, oral airway in place.  Post-op Assessment: Report given to PACU RN and Post -op Vital signs reviewed and stable  Post vital signs: Reviewed and stable  Complications: No apparent anesthesia complications  Last Vitals:  Vitals Value Taken Time  BP 127/73 04/22/22 1153  Temp    Pulse 72 04/22/22 1155  Resp 19 04/22/22 1155  SpO2 99 % 04/22/22 1155  Vitals shown include unvalidated device data.  Last Pain:  Vitals:   04/22/22 0828  TempSrc: Oral  PainSc: 0-No pain      Patients Stated Pain Goal: 3 (10/62/69 4854)  Complications: No notable events documented.

## 2022-04-23 ENCOUNTER — Encounter (HOSPITAL_BASED_OUTPATIENT_CLINIC_OR_DEPARTMENT_OTHER): Payer: Self-pay | Admitting: Obstetrics and Gynecology

## 2022-04-23 ENCOUNTER — Other Ambulatory Visit (HOSPITAL_COMMUNITY): Payer: Self-pay

## 2022-04-23 DIAGNOSIS — D259 Leiomyoma of uterus, unspecified: Secondary | ICD-10-CM | POA: Diagnosis not present

## 2022-04-23 DIAGNOSIS — Z87891 Personal history of nicotine dependence: Secondary | ICD-10-CM | POA: Diagnosis not present

## 2022-04-23 DIAGNOSIS — N920 Excessive and frequent menstruation with regular cycle: Secondary | ICD-10-CM | POA: Diagnosis not present

## 2022-04-23 DIAGNOSIS — N946 Dysmenorrhea, unspecified: Secondary | ICD-10-CM | POA: Diagnosis not present

## 2022-04-23 LAB — SURGICAL PATHOLOGY

## 2022-04-23 LAB — HEMOGLOBIN: Hemoglobin: 11.1 g/dL — ABNORMAL LOW (ref 12.0–15.0)

## 2022-04-23 MED ORDER — ACETAMINOPHEN 500 MG PO TABS
ORAL_TABLET | ORAL | Status: AC
  Filled 2022-04-23: qty 2

## 2022-04-23 MED ORDER — OXYCODONE HCL 5 MG PO TABS
ORAL_TABLET | ORAL | Status: AC
  Filled 2022-04-23: qty 2

## 2022-04-23 MED ORDER — OXYCODONE HCL 5 MG PO TABS
ORAL_TABLET | ORAL | Status: AC
  Filled 2022-04-23: qty 1

## 2022-04-23 MED ORDER — IBUPROFEN 200 MG PO TABS
ORAL_TABLET | ORAL | Status: AC
  Filled 2022-04-23: qty 3

## 2022-04-23 NOTE — Discharge Summary (Signed)
Physician Discharge Summary  Patient ID: Caitlin Foster MRN: [id number] DOB/AGE: 08/21/1980 41 y.o.  Admit date: 04/22/2022 Discharge date: 04/23/2022  Admission Diagnoses: Menorrhagia/ Dysmenorrhea/ Fibroids   Discharge Diagnoses:  Principal Problem:   Menorrhagia   Discharged Condition: stable  Hospital Course: pt was admitted for observation after undergoing a robotic assisted laparoscopic hysterectomy with bilateral salpingectomy. She did well postoperatively with return of bowel and bladder function.   Consults: None  Significant Diagnostic Studies: labs: pod#1 hgb 11.1   Treatments: surgery: robotic assisted laparoscopic hysterectomy with bilateral salpingectomy   Discharge Exam: Blood pressure 125/79, pulse 64, temperature 98.2 F (36.8 C), resp. rate 16, height '5\' 1"'$  (1.549 m), weight 102.3 kg, last menstrual period 04/11/2022, SpO2 98 %. General appearance: alert, cooperative, and no distress GI: soft appropritately tender nondistended  Extremities: extremities normal, atraumatic, no cyanosis or edema Skin: Skin color, texture, turgor normal. No rashes or lesions or normal Incision/Wound: well approximated without erythema or exudate   Disposition: Discharge disposition: 01-Home or Self Care       Discharge Instructions     Call MD for:  persistant nausea and vomiting   Complete by: As directed    Call MD for:  redness, tenderness, or signs of infection (pain, swelling, redness, odor or green/yellow discharge around incision site)   Complete by: As directed    Call MD for:  severe uncontrolled pain   Complete by: As directed    Call MD for:  temperature >100.4   Complete by: As directed    Diet general   Complete by: As directed    Driving Restrictions   Complete by: As directed    Avoid driving for 2 weeks   Increase activity slowly   Complete by: As directed    Lifting restrictions   Complete by: As directed    Avoid lifting over 10 lbs   May  shower / Bathe   Complete by: As directed    May walk up steps   Complete by: As directed    No wound care   Complete by: As directed    Sexual Activity Restrictions   Complete by: As directed    Avoid sex for 6 weeks and until approved by Caitlin Foster      Allergies as of 04/23/2022       Reactions   Levaquin  [levofloxacin In D5w] Itching   Levofloxacin Hives, Itching   Sulfa Antibiotics Rash   Sulfamethoxazole-trimethoprim Rash        Medication List     STOP taking these medications    Saxenda 18 MG/3ML Sopn Generic drug: Liraglutide -Weight Management   TechLite Pen Needles 32G X 4 MM Misc Generic drug: Insulin Pen Needle   Vitamin D3 25 MCG (1000 UT) Caps   Wegovy 0.25 MG/0.5ML Soaj Generic drug: Semaglutide-Weight Management       TAKE these medications    acetaminophen 500 MG tablet Commonly known as: TYLENOL Take 2 tablets (1,000 mg total) by mouth every 8 (eight) hours as needed. What changed: when to take this Notes to patient: Next dose is due at 4 pm    ELDERBERRY PO Take by mouth.   ibuprofen 600 MG tablet Commonly known as: ADVIL Take 1 tablet (600 mg total) by mouth every 6 (six) hours as needed for mild pain, moderate pain or cramping. Notes to patient: Next dose is due at 1230 pm   multivitamin with minerals tablet Take 1 tablet by mouth daily.  oxyCODONE 5 MG immediate release tablet Commonly known as: Oxy IR/ROXICODONE Take 1-2 tablets (5-10 mg total) by mouth every 4 (four) hours as needed for moderate pain. Notes to patient: Next dose is due at 1230 pm    Vitamin D (Ergocalciferol) 1.25 MG (50000 UNIT) Caps capsule Commonly known as: DRISDOL Take 1 capsule (50,000 Units total) by mouth every 7 (seven) days.        Follow-up Information     Christophe Louis, MD. Go in 2 week(s).   Specialty: Obstetrics and Gynecology Contact information: [address] [place] [address] [phone]                  Signed: Christophe Louis 04/23/2022, 8:27 AM

## 2022-05-01 ENCOUNTER — Other Ambulatory Visit (HOSPITAL_COMMUNITY): Payer: Self-pay

## 2022-05-01 DIAGNOSIS — E559 Vitamin D deficiency, unspecified: Secondary | ICD-10-CM | POA: Diagnosis not present

## 2022-05-01 DIAGNOSIS — R7303 Prediabetes: Secondary | ICD-10-CM | POA: Diagnosis not present

## 2022-05-01 MED ORDER — SAXENDA 18 MG/3ML ~~LOC~~ SOPN
0.6000 mg | PEN_INJECTOR | Freq: Every day | SUBCUTANEOUS | 1 refills | Status: DC
  Filled 2022-05-01 – 2022-05-05 (×2): qty 15, 30d supply, fill #0

## 2022-05-01 MED ORDER — BD PEN NEEDLE NANO U/F 32G X 4 MM MISC
0 refills | Status: DC
  Filled 2022-05-01: qty 100, 30d supply, fill #0
  Filled 2022-05-05: qty 100, 90d supply, fill #0

## 2022-05-02 ENCOUNTER — Other Ambulatory Visit (HOSPITAL_COMMUNITY): Payer: Self-pay

## 2022-05-05 ENCOUNTER — Other Ambulatory Visit (HOSPITAL_COMMUNITY): Payer: Self-pay

## 2022-06-04 ENCOUNTER — Other Ambulatory Visit (HOSPITAL_COMMUNITY): Payer: Self-pay

## 2022-06-04 DIAGNOSIS — K5903 Drug induced constipation: Secondary | ICD-10-CM | POA: Diagnosis not present

## 2022-06-04 DIAGNOSIS — E559 Vitamin D deficiency, unspecified: Secondary | ICD-10-CM | POA: Diagnosis not present

## 2022-06-04 DIAGNOSIS — Z9189 Other specified personal risk factors, not elsewhere classified: Secondary | ICD-10-CM | POA: Diagnosis not present

## 2022-06-04 DIAGNOSIS — Z6835 Body mass index (BMI) 35.0-35.9, adult: Secondary | ICD-10-CM | POA: Diagnosis not present

## 2022-06-04 DIAGNOSIS — E669 Obesity, unspecified: Secondary | ICD-10-CM | POA: Diagnosis not present

## 2022-06-04 MED ORDER — WEGOVY 1 MG/0.5ML ~~LOC~~ SOAJ
1.0000 mg | SUBCUTANEOUS | 0 refills | Status: DC
  Filled 2022-06-04: qty 2, 28d supply, fill #0

## 2022-06-05 ENCOUNTER — Other Ambulatory Visit (HOSPITAL_COMMUNITY): Payer: Self-pay

## 2022-06-06 ENCOUNTER — Other Ambulatory Visit (HOSPITAL_COMMUNITY): Payer: Self-pay

## 2022-06-06 MED ORDER — WEGOVY 1.7 MG/0.75ML ~~LOC~~ SOAJ
1.7000 mg | SUBCUTANEOUS | 0 refills | Status: DC
  Filled 2022-06-06 – 2022-07-03 (×2): qty 3, 28d supply, fill #0

## 2022-06-18 DIAGNOSIS — E559 Vitamin D deficiency, unspecified: Secondary | ICD-10-CM | POA: Diagnosis not present

## 2022-06-18 DIAGNOSIS — Z6835 Body mass index (BMI) 35.0-35.9, adult: Secondary | ICD-10-CM | POA: Diagnosis not present

## 2022-06-18 DIAGNOSIS — E669 Obesity, unspecified: Secondary | ICD-10-CM | POA: Diagnosis not present

## 2022-06-18 DIAGNOSIS — Z9189 Other specified personal risk factors, not elsewhere classified: Secondary | ICD-10-CM | POA: Diagnosis not present

## 2022-07-04 ENCOUNTER — Other Ambulatory Visit (HOSPITAL_COMMUNITY): Payer: Self-pay

## 2022-07-31 ENCOUNTER — Other Ambulatory Visit (HOSPITAL_COMMUNITY): Payer: Self-pay

## 2022-07-31 MED ORDER — WEGOVY 1.7 MG/0.75ML ~~LOC~~ SOAJ
1.7000 mg | SUBCUTANEOUS | 0 refills | Status: DC
  Filled 2022-07-31: qty 3, 28d supply, fill #0

## 2022-08-07 ENCOUNTER — Other Ambulatory Visit (HOSPITAL_COMMUNITY): Payer: Self-pay

## 2022-08-21 ENCOUNTER — Other Ambulatory Visit (HOSPITAL_COMMUNITY): Payer: Self-pay

## 2022-09-01 ENCOUNTER — Other Ambulatory Visit (HOSPITAL_COMMUNITY): Payer: Self-pay

## 2022-09-01 DIAGNOSIS — R7303 Prediabetes: Secondary | ICD-10-CM | POA: Diagnosis not present

## 2022-09-01 DIAGNOSIS — Z6833 Body mass index (BMI) 33.0-33.9, adult: Secondary | ICD-10-CM | POA: Diagnosis not present

## 2022-09-01 DIAGNOSIS — E559 Vitamin D deficiency, unspecified: Secondary | ICD-10-CM | POA: Diagnosis not present

## 2022-09-01 DIAGNOSIS — E669 Obesity, unspecified: Secondary | ICD-10-CM | POA: Diagnosis not present

## 2022-09-01 MED ORDER — WEGOVY 2.4 MG/0.75ML ~~LOC~~ SOAJ
2.4000 mg | SUBCUTANEOUS | 1 refills | Status: DC
  Filled 2022-09-01: qty 3, 28d supply, fill #0
  Filled 2022-10-02: qty 3, 28d supply, fill #1

## 2022-10-02 ENCOUNTER — Other Ambulatory Visit (HOSPITAL_COMMUNITY): Payer: Self-pay

## 2022-10-02 ENCOUNTER — Ambulatory Visit (INDEPENDENT_AMBULATORY_CARE_PROVIDER_SITE_OTHER): Payer: Commercial Managed Care - PPO

## 2022-10-02 ENCOUNTER — Encounter: Payer: Self-pay | Admitting: Orthopaedic Surgery

## 2022-10-02 ENCOUNTER — Ambulatory Visit (INDEPENDENT_AMBULATORY_CARE_PROVIDER_SITE_OTHER): Payer: Commercial Managed Care - PPO | Admitting: Orthopaedic Surgery

## 2022-10-02 DIAGNOSIS — G8929 Other chronic pain: Secondary | ICD-10-CM

## 2022-10-02 DIAGNOSIS — M25561 Pain in right knee: Secondary | ICD-10-CM

## 2022-10-02 MED ORDER — DICLOFENAC SODIUM 75 MG PO TBEC
75.0000 mg | DELAYED_RELEASE_TABLET | Freq: Two times a day (BID) | ORAL | 1 refills | Status: DC | PRN
  Filled 2022-10-02 (×2): qty 60, 30d supply, fill #0

## 2022-10-02 NOTE — Progress Notes (Deleted)
Office Visit Note   Patient: Caitlin Foster           Date of Birth: [birth year]           MRN: [id number] Visit Date: 10/02/2022              Requested by: Flossie Buffy, NP [place],   [id number] PCP: Flossie Buffy, NP   Assessment & Plan: Visit Diagnoses:  1. Chronic pain of right knee     Plan: ***  Follow-Up Instructions: Return in about 4 weeks (around 10/30/2022).   Orders:  Orders Placed This Encounter  Procedures   XR Knee 1-2 Views Right   Meds ordered this encounter  Medications   diclofenac (VOLTAREN) 75 MG EC tablet    Sig: Take 1 tablet (75 mg total) by mouth 2 (two) times daily between meals as needed.    Dispense:  60 tablet    Refill:  1      Procedures: No procedures performed   Clinical Data: No additional findings.   Subjective: Chief Complaint  Patient presents with   Right Knee - Pain    HPI  Review of Systems   Objective: Vital Signs: There were no vitals taken for this visit.  Physical Exam  Ortho Exam  Specialty Comments:  No specialty comments available.  Imaging: XR Knee 1-2 Views Right  Result Date: 10/02/2022 2 views of the right knee show no acute findings.  There is spurring off of the superior patella at the insertion site of the quad tendon.  The joint spaces well-maintained in both views.    PMFS History: Patient Active Problem List   Diagnosis Date Noted   Menorrhagia 04/22/2022   Class 2 obesity due to excess calories with body mass index (BMI) of 36.0 to 36.9 in adult 06/10/2021   Mixed hyperlipidemia 08/25/2019   Former tobacco use 11/30/2017   Chronic back pain 09/24/2017   Headache 09/24/2017   Vitamin D deficiency 09/24/2017   Depression, recurrent ([place])    Hair loss 07/09/2012   Past Medical History:  Diagnosis Date   Anxiety    04/08/22 Pt states that she does not take medications or see a doctor for anxiety & depression.   Depression    04/08/22 Pt  states that she does not take medications or see a doctor for anxiety or depression.   Dysmenorrhea    Pt follows with Dr. Christophe Louis, LOV 01/23/22.   Headache    hx of headaches, usually occur w/ periods   Mixed hyperlipidemia    instructed by PCP to eat a healthy diet and exercise   Obesity (BMI 35.0-39.9 without comorbidity)    Pt follows w/ PCP Flossie Buffy, NP, LOV 03/13/22 in Epic.   Scoliosis    Patient had surgery when she was 42 years old.   Vitamin D deficiency 2019    Family History  Problem Relation Age of Onset   Diabetes Mother    Depression Mother    Cancer Father 67       prostate   Depression Maternal Aunt    Early death Paternal Aunt 16   Heart attack Paternal Aunt    Diabetes Maternal Grandmother    Early death Paternal Grandmother 62    Past Surgical History:  Procedure Laterality Date   BACK SURGERY  1996   Harrington Rods secondary to scoliosis   ROBOTIC ASSISTED TOTAL HYSTERECTOMY WITH BILATERAL SALPINGO OOPHERECTOMY N/A 04/22/2022  Procedure: XI ROBOTIC ASSISTED TOTAL HYSTERECTOMY WITH BILATERAL SALPINGectomy;  Surgeon: Christophe Louis, MD;  Location: [facility];  Service: Gynecology;  Laterality: N/A;   Social History   Occupational History   Occupation: LPN  Tobacco Use   Smoking status: Former    Packs/day: 0.50    Years: 10.00    Total pack years: 5.00    Types: E-cigarettes, Cigarettes    Quit date: 12/06/2021    Years since quitting: 0.8   Smokeless tobacco: Never   Tobacco comments:    spoke newports, menthol  Vaping Use   Vaping Use: Never used  Substance and Sexual Activity   Alcohol use: Not Currently    Comment: social   Drug use: No   Sexual activity: Not on file

## 2022-10-02 NOTE — Progress Notes (Signed)
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  She still has been experiencing pain in her knee and she states that it is worse when she has been on her feet working all day long.  She points to the patella as a source of her pain but also in the posterior aspect of her knee.  Most of her pain is when she extends her knee.  She is never had surgery on the knee and she has not had any type of therapy for the knee.  She does take occasional anti-inflammatories and again I did place a steroid injection in her right knee and she said that helped for about a week.  Examination of her right knee she has both knees slightly hyperextended.  There is no effusion.  She has a lot of pain at the quad tendon insertion at the patella but also behind the knee.  Her Lachman's and McMurray's exams are negative.  2 views of the right knee show well-maintained joint space.  There is spurring at the superior aspect of patella at the insertion site of the quad tendon so this likely represents chronic tendinosis of the quad tendon.  The remainder of the knee x-ray appears normal.  I would like to send her to outpatient physical therapy for any modalities that can decrease her knee pain as well as strengthen the muscles around the knee.  We would certainly have them focus on quad tendinitis.  I will also start her on diclofenac as an anti-inflammatory and have recommended a knee sleeve that she can wear during work.  Will see her back in 4 weeks after course of therapy and anti-inflammatories.  We talked about the possibility at some point of MRI of her knee.  She does have Harrington rods in her back from childhood scoliosis that was treated with surgery in 1996.  These are likely MRI compatible.  Regardless, we will see her back in 4 weeks to see how she is doing overall.  All questions  and concerns were addressed and answered.

## 2022-10-03 ENCOUNTER — Other Ambulatory Visit: Payer: Self-pay

## 2022-10-03 DIAGNOSIS — G8929 Other chronic pain: Secondary | ICD-10-CM

## 2022-10-07 ENCOUNTER — Other Ambulatory Visit (HOSPITAL_COMMUNITY): Payer: Self-pay

## 2022-10-07 DIAGNOSIS — R7303 Prediabetes: Secondary | ICD-10-CM | POA: Diagnosis not present

## 2022-10-07 DIAGNOSIS — E669 Obesity, unspecified: Secondary | ICD-10-CM | POA: Diagnosis not present

## 2022-10-07 DIAGNOSIS — Z6832 Body mass index (BMI) 32.0-32.9, adult: Secondary | ICD-10-CM | POA: Diagnosis not present

## 2022-10-07 DIAGNOSIS — E559 Vitamin D deficiency, unspecified: Secondary | ICD-10-CM | POA: Diagnosis not present

## 2022-10-07 MED ORDER — WEGOVY 2.4 MG/0.75ML ~~LOC~~ SOAJ
2.4000 mg | SUBCUTANEOUS | 1 refills | Status: DC
  Filled 2022-10-07 – 2022-10-26 (×2): qty 3, 28d supply, fill #0
  Filled 2022-11-25: qty 3, 28d supply, fill #1

## 2022-10-11 ENCOUNTER — Other Ambulatory Visit (HOSPITAL_COMMUNITY): Payer: Self-pay

## 2022-10-22 ENCOUNTER — Ambulatory Visit: Payer: Commercial Managed Care - PPO | Attending: Orthopaedic Surgery | Admitting: Physical Therapy

## 2022-10-22 ENCOUNTER — Other Ambulatory Visit: Payer: Self-pay

## 2022-10-22 DIAGNOSIS — R2689 Other abnormalities of gait and mobility: Secondary | ICD-10-CM | POA: Diagnosis not present

## 2022-10-22 DIAGNOSIS — G8929 Other chronic pain: Secondary | ICD-10-CM | POA: Insufficient documentation

## 2022-10-22 DIAGNOSIS — M25661 Stiffness of right knee, not elsewhere classified: Secondary | ICD-10-CM | POA: Insufficient documentation

## 2022-10-22 DIAGNOSIS — M6281 Muscle weakness (generalized): Secondary | ICD-10-CM | POA: Insufficient documentation

## 2022-10-22 DIAGNOSIS — M25561 Pain in right knee: Secondary | ICD-10-CM | POA: Diagnosis not present

## 2022-10-22 NOTE — Therapy (Signed)
OUTPATIENT PHYSICAL THERAPY LOWER EXTREMITY EVALUATION   Patient Name: Caitlin Foster MRN: [id number] DOB:Oct 22, 1980, 42 y.o., female Today's Date: 10/22/2022  END OF SESSION:  PT End of Session - 10/22/22 0850     Visit Number 1    Number of Visits 16    Date for PT Re-Evaluation 12/17/22    Authorization Type [employer] [employer]    PT Start Time [phone]    PT Stop Time 0930    PT Time Calculation (min) 44 min    Activity Tolerance Patient tolerated treatment well             Past Medical History:  Diagnosis Date   Anxiety    04/08/22 Pt states that she does not take medications or see a doctor for anxiety & depression.   Depression    04/08/22 Pt states that she does not take medications or see a doctor for anxiety or depression.   Dysmenorrhea    Pt follows with Dr. Christophe Louis, LOV 01/23/22.   Headache    hx of headaches, usually occur w/ periods   Mixed hyperlipidemia    instructed by PCP to eat a healthy diet and exercise   Obesity (BMI 35.0-39.9 without comorbidity)    Pt follows w/ PCP Flossie Buffy, NP, LOV 03/13/22 in Epic.   Scoliosis    Patient had surgery when she was 42 years old.   Vitamin D deficiency 2019   Past Surgical History:  Procedure Laterality Date   BACK SURGERY  1996   Harrington Rods secondary to scoliosis   ROBOTIC ASSISTED TOTAL HYSTERECTOMY WITH BILATERAL SALPINGO OOPHERECTOMY N/A 04/22/2022   Procedure: XI ROBOTIC ASSISTED TOTAL HYSTERECTOMY WITH BILATERAL SALPINGectomy;  Surgeon: Christophe Louis, MD;  Location: [facility];  Service: Gynecology;  Laterality: N/A;   Patient Active Problem List   Diagnosis Date Noted   Menorrhagia 04/22/2022   Class 2 obesity due to excess calories with body mass index (BMI) of 36.0 to 36.9 in adult 06/10/2021   Mixed hyperlipidemia 08/25/2019   Former tobacco use 11/30/2017   Chronic back pain 09/24/2017   Headache 09/24/2017   Vitamin D deficiency 09/24/2017   Depression, recurrent ([place])     Hair loss 07/09/2012    PCP: Flossie Buffy, NP  REFERRING PROVIDER: Mcarthur Rossetti, MD  REFERRING DIAG: [phone] (ICD-10-CM) - Chronic pain of right knee  THERAPY DIAG:  Chronic pain of right knee  Stiffness of right knee, not elsewhere classified  Muscle weakness (generalized)  Other abnormalities of gait and mobility  Rationale for Evaluation and Treatment: Rehabilitation  ONSET DATE: ~a few months ago  SUBJECTIVE:   SUBJECTIVE STATEMENT: Pt reports she noticed it a few months ago. Can feel it with increased time standing on her knee. Can feel it when going up/down stairs. Pt was told by Dr. Ninfa Linden to get a brace but feels it gets too tight during work that it becomes uncomfortable. Pt took voltaren a couple of times. Tylenol and ibuprofen does help.   PERTINENT HISTORY: No prior history of injuries  PAIN:  Are you having pain? Yes: NPRS scale: 2 currently, at worst 10/10 Pain location: Back of knee Pain description: Deep throbbing pain Aggravating factors: end of day after working, stairs Relieving factors: Pain medication, ice, elevation  PRECAUTIONS: None  WEIGHT BEARING RESTRICTIONS: No  FALLS:  Has patient fallen in last 6 months? 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  There is spurring off  of the superior patella at the insertion site of the quad tendon.  The  joint spaces well-maintained in both views.   PATIENT SURVEYS:  FOTO 59; predicted 47  COGNITION: Overall cognitive status: Within functional limits for tasks  assessed     SENSATION: WFL  EDEMA:  Reports general knee edema  MUSCLE LENGTH: Hamstrings: WFL Thomas test: WFL  POSTURE:  bilat knee valgus  PALPATION: TTP and very taut posterior knee/popoliteus, no overt tenderness or tension in hamstring or quad muscle belly. Tenderness when checking for patellar mobility  LOWER EXTREMITY ROM:  Active ROM Right eval Left eval  Hip flexion    Hip extension    Hip abduction    Hip adduction    Hip internal rotation    Hip external rotation    Knee flexion 125 130  Knee extension -8 5  Ankle dorsiflexion    Ankle plantarflexion    Ankle inversion    Ankle eversion     (Blank rows = not tested)  LOWER EXTREMITY MMT:  MMT Right eval Left eval  Hip flexion    Hip extension 3 5  Hip abduction 3 5  Hip adduction    Hip internal rotation 3+ 5  Hip external rotation 3+ 5  Knee flexion 4 5  Knee extension 4+ 5  Ankle dorsiflexion    Ankle plantarflexion    Ankle inversion    Ankle eversion     (Blank rows = not tested)  LOWER EXTREMITY SPECIAL TESTS:  Did not assess  FUNCTIONAL TESTS:  SLS: L 20 sec; R 8 sec  GAIT: Distance walked: 100' Assistive device utilized: None Level of assistance: Complete Independence Comments: Mildly antalgic with decreased R LE weight shift   TODAY'S TREATMENT:                                                                                                                              DATE: 10/22/22 See HEP below    PATIENT EDUCATION:  Education details: Exam findings, POC, initial HEP Person educated: Patient Education method: Explanation, Demonstration, and Handouts Education comprehension: verbalized understanding, returned demonstration, and needs further education  HOME EXERCISE PROGRAM: Access Code: [email or web address] URL: [email or web address] Date: 10/22/2022 Prepared by: Estill Bamberg April Thurnell Garbe  Exercises - Seated Hamstring Stretch  - 1 x daily - 7 x weekly - 2  sets - 30 sec hold - Gastroc Stretch on Wall  - 1 x daily - 7 x weekly - 2 sets - 30 sec hold - Standing Hamstring Stretch on Counter  - 1 x daily - 7 x weekly -  2 sets - 30 sec hold - Standing Quad Stretch with Strap  - 1 x daily - 7 x weekly - 2 sets - 30 sec hold - Clamshell  - 1 x daily - 7 x weekly - 2 sets - 10 reps  ASSESSMENT:  CLINICAL IMPRESSION: Patient is a 42 y.o. F who was seen today for physical therapy evaluation and treatment for R knee pain. X-ray demos spur in superior patella. Assessment significant for decreased knee ROM on R vs L with gross R LE weakness. Pt with trigger point and edema noted in posterior knee along popliteus. Pt would benefit from PT to address these deficits for improved tolerance with prolonged standing for work and home tasks.   OBJECTIVE IMPAIRMENTS: Abnormal gait, decreased activity tolerance, decreased balance, decreased endurance, decreased mobility, difficulty walking, decreased ROM, decreased strength, increased edema, increased fascial restrictions, increased muscle spasms, improper body mechanics, postural dysfunction, and pain.   ACTIVITY LIMITATIONS: bending, standing, squatting, stairs, transfers, locomotion level, and caring for others  PARTICIPATION LIMITATIONS: community activity and occupation  PERSONAL FACTORS: Fitness, Past/current experiences, Profession, and Time since onset of injury/illness/exacerbation are also affecting patient's functional outcome.   REHAB POTENTIAL: Good  CLINICAL DECISION MAKING: Evolving/moderate complexity  EVALUATION COMPLEXITY: Moderate   GOALS: Goals reviewed with patient? Yes  SHORT TERM GOALS: Target date: 11/19/2022  Pt will be ind with initial HEP Baseline: Goal status: INITIAL  2.  Pt will demo L = R knee ROM Baseline:  Goal status: INITIAL  LONG TERM GOALS: Target date: 12/17/2022   Pt will be ind with maintenance and progression of HEP Baseline:  Goal status: INITIAL  2.  Pt  will be able to perform SLS on R LE for >/=15 sec to demo increased stability Baseline:  Goal status: INITIAL  3.  Pt will be able to rate pain as </=1/10 by end of a work day Baseline:  Goal status: INITIAL  4.  Pt will be able to ascend/descend 15 steps with reciprocal gait pattern no HR to demo improving functional LE strength Baseline:  Goal status: INITIAL  5.  Pt will have improved FOTO score to >/=72 Baseline:  Goal status: INITIAL    PLAN:  PT FREQUENCY: 1-2x/week  PT DURATION: 8 weeks  PLANNED INTERVENTIONS: Therapeutic exercises, Therapeutic activity, Neuromuscular re-education, Balance training, Gait training, Patient/Family education, Self Care, Joint mobilization, Stair training, Aquatic Therapy, Dry Needling, Electrical stimulation, Cryotherapy, Moist heat, Taping, Vasopneumatic device, Ionotophoresis 4mg /ml Dexamethasone, Manual therapy, and Re-evaluation  PLAN FOR NEXT SESSION: Assess response to HEP. Continue to stretch/provide manual to posterior knee. Consider popliteus mobilizations. Progress hip strengthening.    Ashlynne Shetterly April Ma L Remmy Riffe, PT 10/22/2022, 12:21 PM

## 2022-10-26 ENCOUNTER — Other Ambulatory Visit: Payer: Self-pay

## 2022-10-27 ENCOUNTER — Other Ambulatory Visit (HOSPITAL_COMMUNITY): Payer: Self-pay

## 2022-10-29 ENCOUNTER — Ambulatory Visit: Payer: Commercial Managed Care - PPO | Admitting: Physical Therapy

## 2022-10-29 ENCOUNTER — Encounter: Payer: Self-pay | Admitting: Physical Therapy

## 2022-10-29 DIAGNOSIS — G8929 Other chronic pain: Secondary | ICD-10-CM

## 2022-10-29 DIAGNOSIS — R2689 Other abnormalities of gait and mobility: Secondary | ICD-10-CM

## 2022-10-29 DIAGNOSIS — M25561 Pain in right knee: Secondary | ICD-10-CM | POA: Diagnosis not present

## 2022-10-29 DIAGNOSIS — M6281 Muscle weakness (generalized): Secondary | ICD-10-CM

## 2022-10-29 DIAGNOSIS — M25661 Stiffness of right knee, not elsewhere classified: Secondary | ICD-10-CM

## 2022-10-29 NOTE — Therapy (Signed)
OUTPATIENT PHYSICAL THERAPY TREATMENT NOTE   Patient Name: Caitlin Foster MRN: [id number] DOB:1981-07-26, 42 y.o., female Today's Date: 10/29/2022  PCP: Flossie Buffy, NP   REFERRING PROVIDER: Mcarthur Rossetti, MD   PT End of Session - 10/29/22 1131     Visit Number 2    Number of Visits 16    Date for PT Re-Evaluation 12/17/22    Authorization Type [employer]    PT Start Time 1130    PT Stop Time 1211    PT Time Calculation (min) 41 min    Activity Tolerance Patient tolerated treatment well             Past Medical History:  Diagnosis Date   Anxiety    04/08/22 Pt states that she does not take medications or see a doctor for anxiety & depression.   Depression    04/08/22 Pt states that she does not take medications or see a doctor for anxiety or depression.   Dysmenorrhea    Pt follows with Dr. Christophe Louis, LOV 01/23/22.   Headache    hx of headaches, usually occur w/ periods   Mixed hyperlipidemia    instructed by PCP to eat a healthy diet and exercise   Obesity (BMI 35.0-39.9 without comorbidity)    Pt follows w/ PCP Flossie Buffy, NP, LOV 03/13/22 in Epic.   Scoliosis    Patient had surgery when she was 42 years old.   Vitamin D deficiency 2019   Past Surgical History:  Procedure Laterality Date   BACK SURGERY  1996   Harrington Rods secondary to scoliosis   ROBOTIC ASSISTED TOTAL HYSTERECTOMY WITH BILATERAL SALPINGO OOPHERECTOMY N/A 04/22/2022   Procedure: XI ROBOTIC ASSISTED TOTAL HYSTERECTOMY WITH BILATERAL SALPINGectomy;  Surgeon: Christophe Louis, MD;  Location: [facility];  Service: Gynecology;  Laterality: N/A;   Patient Active Problem List   Diagnosis Date Noted   Menorrhagia 04/22/2022   Class 2 obesity due to excess calories with body mass index (BMI) of 36.0 to 36.9 in adult 06/10/2021   Mixed hyperlipidemia 08/25/2019   Former tobacco use 11/30/2017   Chronic back pain 09/24/2017   Headache 09/24/2017   Vitamin D  deficiency 09/24/2017   Depression, recurrent ([place])    Hair loss 07/09/2012    THERAPY DIAG:  Chronic pain of right knee  Stiffness of right knee, not elsewhere classified  Muscle weakness (generalized)  Other abnormalities of gait and mobility   Rationale for Evaluation and Treatment Rehabilitation  REFERRING DIAG: M25.561,G89.29 (ICD-10-CM) - Chronic pain of right knee   PERTINENT HISTORY: none  PRECAUTIONS/RESTRICTIONS:   none  SUBJECTIVE:  Pt reports that her knee feels about the same.  She has been somewhat compliant with her HEP.    PAIN:  Are you having pain? Yes: NPRS scale: 2 currently, at worst 10/10 Pain location: Back of knee Pain description: Deep throbbing pain Aggravating factors: end of day after working, stairs Relieving factors: Pain medication, ice, elevation  OBJECTIVE: (objective measures completed at initial evaluation unless otherwise dated)  DIAGNOSTIC FINDINGS: R knee x-ray 10/02/22: 2 views of the right knee show no acute findings.  There is spurring off  of the superior patella at the insertion site of the quad tendon.  The  joint spaces well-maintained in both views.    PATIENT SURVEYS:  FOTO 59; predicted 18   COGNITION: Overall cognitive status: Within functional limits for tasks assessed  SENSATION: WFL   EDEMA:  Reports general knee edema   MUSCLE LENGTH: Hamstrings: WFL Thomas test: WFL   POSTURE:  bilat knee valgus   PALPATION: TTP and very taut posterior knee/popoliteus, no overt tenderness or tension in hamstring or quad muscle belly. Tenderness when checking for patellar mobility   LOWER EXTREMITY ROM:   Active ROM Right eval Left eval  Hip flexion      Hip extension      Hip abduction      Hip adduction      Hip internal rotation      Hip external rotation      Knee flexion 125 130  Knee extension -8 5  Ankle dorsiflexion      Ankle plantarflexion      Ankle inversion      Ankle  eversion       (Blank rows = not tested)   LOWER EXTREMITY MMT:   MMT Right eval Left eval  Hip flexion      Hip extension 3 5  Hip abduction 3 5  Hip adduction      Hip internal rotation 3+ 5  Hip external rotation 3+ 5  Knee flexion 4 5  Knee extension 4+ 5  Ankle dorsiflexion      Ankle plantarflexion      Ankle inversion      Ankle eversion       (Blank rows = not tested)   LOWER EXTREMITY SPECIAL TESTS:  Did not assess   FUNCTIONAL TESTS:  SLS: L 20 sec; R 8 sec   GAIT: Distance walked: 100' Assistive device utilized: None Level of assistance: Complete Independence Comments: Mildly antalgic with decreased R LE weight shift     TODAY'S TREATMENT:                                                                                                                              DATE: 10/22/22 See HEP below      PATIENT EDUCATION:  Education details: Exam findings, POC, initial HEP Person educated: Patient Education method: Explanation, Demonstration, and Handouts Education comprehension: verbalized understanding, returned demonstration, and needs further education   HOME EXERCISE PROGRAM: Access Code: [email or web address] URL: [email or web address] Date: 10/29/2022 Prepared by: Shearon Balo  Exercises - Seated Hamstring Stretch  - 1 x daily - 7 x weekly - 2 sets - 30 sec hold - Gastroc Stretch on Wall  - 1 x daily - 7 x weekly - 2 sets - 30 sec hold - Standing Hamstring Stretch on Counter  - 1 x daily - 7 x weekly - 2 sets - 30 sec hold - Standing Quad Stretch with Strap  - 1 x daily - 7 x weekly - 2 sets - 30 sec hold - Supine Straight Leg Raises  - 1 x daily - 7 x weekly - 3 sets - 10 reps - Clam with Resistance  - 1 x daily - 7 x weekly -  3 sets - 10 reps  TREATMENT 3/27:  Therapeutic Exercise: - Bike 5 min for warm up - SLR - 10x, 7x, 7x - Bridge - 3x10 - S/L clam - GTB - 3x10 - Pilates ring squeeze - 5'' hold 2x10 - knee ext machine - 5# x10 -  10# 2x10 - knee flexion machine - 20# - 3x10  CLINICAL IMPRESSION: Luca tolerated session well with no adverse reaction.  Significant reports of muscle fatigue throughout.  Popliteal pain is suspicious for Baker's cyst.   OBJECTIVE IMPAIRMENTS: Abnormal gait, decreased activity tolerance, decreased balance, decreased endurance, decreased mobility, difficulty walking, decreased ROM, decreased strength, increased edema, increased fascial restrictions, increased muscle spasms, improper body mechanics, postural dysfunction, and pain.    ACTIVITY LIMITATIONS: bending, standing, squatting, stairs, transfers, locomotion level, and caring for others   PARTICIPATION LIMITATIONS: community activity and occupation   PERSONAL FACTORS: Fitness, Past/current experiences, Profession, and Time since onset of injury/illness/exacerbation are also affecting patient's functional outcome.    REHAB POTENTIAL: Good   CLINICAL DECISION MAKING: Evolving/moderate complexity   EVALUATION COMPLEXITY: Moderate     GOALS: Goals reviewed with patient? Yes   SHORT TERM GOALS: Target date: 11/19/2022   Pt will be ind with initial HEP Baseline: Goal status: INITIAL   2.  Pt will demo L = R knee ROM Baseline:  Goal status: INITIAL   LONG TERM GOALS: Target date: 12/17/2022     Pt will be ind with maintenance and progression of HEP Baseline:  Goal status: INITIAL   2.  Pt will be able to perform SLS on R LE for >/=15 sec to demo increased stability Baseline:  Goal status: INITIAL   3.  Pt will be able to rate pain as </=1/10 by end of a work day Baseline:  Goal status: INITIAL   4.  Pt will be able to ascend/descend 15 steps with reciprocal gait pattern no HR to demo improving functional LE strength Baseline:  Goal status: INITIAL   5.  Pt will have improved FOTO score to >/=72 Baseline:  Goal status: INITIAL       PLAN:   PT FREQUENCY: 1-2x/week   PT DURATION: 8 weeks   PLANNED  INTERVENTIONS: Therapeutic exercises, Therapeutic activity, Neuromuscular re-education, Balance training, Gait training, Patient/Family education, Self Care, Joint mobilization, Stair training, Aquatic Therapy, Dry Needling, Electrical stimulation, Cryotherapy, Moist heat, Taping, Vasopneumatic device, Ionotophoresis 4mg /ml Dexamethasone, Manual therapy, and Re-evaluation   PLAN FOR NEXT SESSION: Assess response to HEP. Continue to stretch/provide manual to posterior knee. Consider popliteus mobilizations. Progress hip strengthening.    Kevan Ny Kip Kautzman PT 10/29/2022, 12:16 PM

## 2022-11-03 ENCOUNTER — Ambulatory Visit (INDEPENDENT_AMBULATORY_CARE_PROVIDER_SITE_OTHER): Payer: Commercial Managed Care - PPO | Admitting: Orthopaedic Surgery

## 2022-11-03 DIAGNOSIS — M25561 Pain in right knee: Secondary | ICD-10-CM

## 2022-11-03 DIAGNOSIS — G8929 Other chronic pain: Secondary | ICD-10-CM | POA: Diagnosis not present

## 2022-11-03 NOTE — Progress Notes (Signed)
The patient is a 42 year old female who continues to follow-up as a relates to continued right knee pain.  We did place a steroid injection in her right knee about 1 to 2 months ago.  She has now been through physical therapy with her knee and the therapist feel like she may have a Baker's cyst.  I was concerned about the potential for meniscal tear.  She has pain in the back of her knee and some on the medial aspect of her knee.  She says therapy and the injection has helped just a little bit.  We have also try diclofenac.  She was concerned about the possibility of an MRI given the fact that she does have Harrington rods and her spine from childhood scoliosis.  Examination of her right knee still does show medial joint line tenderness.  There is not a true McMurray's exam in terms of me getting her knee to lock up when I rotate the tibia on the femur both internal and external rotation from flexion to extension.  It is difficult to tell if there is some fullness in the back of her right knee comparing the right and left knees.  I would like to send her to my partner Dr. Elba Barman is a regular scheduled appointment so he can assess her right knee and see under ultrasound whether or not he feels that there may be a Baker's cyst at the popliteal area of the right knee as well as assessing the muscles around the knee and especially to see if there is the potential for medial meniscal tear.  Based on his findings, he can then get her back to me or order MRI if this is not conclusive.  I did give her reassurance that her rods are likely MRI compatible which we have seen with hardware such as this before.  She agrees with this treatment plan.

## 2022-11-05 ENCOUNTER — Ambulatory Visit: Payer: Commercial Managed Care - PPO | Admitting: Physical Therapy

## 2022-11-07 ENCOUNTER — Other Ambulatory Visit: Payer: Self-pay

## 2022-11-07 ENCOUNTER — Encounter: Payer: Self-pay | Admitting: Sports Medicine

## 2022-11-07 ENCOUNTER — Ambulatory Visit (INDEPENDENT_AMBULATORY_CARE_PROVIDER_SITE_OTHER): Payer: Commercial Managed Care - PPO | Admitting: Sports Medicine

## 2022-11-07 DIAGNOSIS — M25561 Pain in right knee: Secondary | ICD-10-CM

## 2022-11-07 DIAGNOSIS — G8929 Other chronic pain: Secondary | ICD-10-CM | POA: Diagnosis not present

## 2022-11-07 DIAGNOSIS — M7121 Synovial cyst of popliteal space [Baker], right knee: Secondary | ICD-10-CM

## 2022-11-07 MED ORDER — LIDOCAINE HCL 1 % IJ SOLN
3.0000 mL | INTRAMUSCULAR | Status: AC | PRN
  Administered 2022-11-07: 3 mL

## 2022-11-07 MED ORDER — BUPIVACAINE HCL 0.25 % IJ SOLN
2.0000 mL | INTRAMUSCULAR | Status: AC | PRN
  Administered 2022-11-07: 2 mL via INTRA_ARTICULAR

## 2022-11-07 MED ORDER — METHYLPREDNISOLONE ACETATE 40 MG/ML IJ SUSP
80.0000 mg | INTRAMUSCULAR | Status: AC | PRN
  Administered 2022-11-07: 80 mg via INTRA_ARTICULAR

## 2022-11-07 NOTE — Progress Notes (Signed)
Caitlin Platengela T Wohler - 42 y.o. female MRN [id number]  Date of birth: 26-Oct-1980  Office Visit Note: Visit Date: 11/07/2022 PCP: Anne NgNche, Charlotte Lum, NP Referred by: Anne NgNche, Charlotte Lum, NP  Subjective: Chief Complaint  Patient presents with   Right Knee - Pain   HPI: Caitlin Foster is a pleasant 42 y.o. female who presents today for acute on chronic right knee pain. She works for [employer] in [employer].  Caitlin Foster has had right knee pain for many months now.  Did have a steroid injection months back which gave her excellent relief of her pain for 1-2 weeks but then her pain returned.  She is doing physical therapy.  Evaluated by my partner Dr. Magnus IvanBlackman, recommended ultrasound examination and further workup per my recommendations.  Sounds like he may have been a concern for Baker's cyst or medial meniscal injury.  Pertinent ROS were reviewed with the patient and found to be negative unless otherwise specified above in HPI.   Assessment & Plan: Visit Diagnoses:  1. Baker's cyst of knee, right   2. Chronic pain of right knee    Plan: Discussed with Caitlin Foster that based on ultrasound findings, she did have a large Baker's cyst.  Through shared decision making elected to proceed with aspiration and injection.  Patient tolerated well.  I discussed with her that Baker's cyst is usually a sign of abnormal pathology within the knee.  On ultrasound exam I was able to evaluate the meniscus and do feel like I visualized a tear within the posterior horn of the medial meniscus, however ultrasound is not the ideal modality to evaluate this and likely would better be served with an MRI.  She is hesitant about this given her Harrington rods.  I will pass this information along to Dr. Magnus IvanBlackman to discuss next steps.  If her Baker's cyst does not return, she would likely continue physical therapy.  If it does recur, next steps may be the evaluated further with MRI or possible simple arthroscopy.  Would like her to take  ibuprofen twice daily for the next 2-3 days, ice and obtain neoprene compression sleeve for support during help with prevention of recurrence of the cyst and swelling.  Follow-up: Return for Notify Dr. Magnus IvanBlackman or myself how you are doing in 2 weeks.   Meds & Orders: No orders of the defined types were placed in this encounter.   Orders Placed This Encounter  Procedures   Large Joint Inj   US Extrem Low Right Ltd     Procedures: Large Joint Inj: R knee on 11/07/2022 2:53 PM Indications: pain and joint swelling Details: 18 G 1.5 in needle, ultrasound-guided posterior approach Medications: 3 mL lidocaine 1 %; 2 mL bupivacaine 0.25 %; 80 mg methylPREDNISolone acetate 40 MG/ML Aspirate: clear and yellow Outcome: tolerated well, no immediate complications  After discussion on risks/benefits/indications, informed verbal consent was obtained and a timeout was performed. The patient was lying prone on exam table and the patient's knee was prepped with Chloraprep and alcohol swabs. Utilizing ultrasound-guidance with the probe in a longitudinal position, the Baker's cyst was identified and the surrounding soft tissue area was anesthesized first with a 25G, 1.5" needle with approximately 3 cc of lidocaine 1% using sterile technique. Following this, using ultrasound guidance via an in-plane approach, an 18G, 1.5" needle was directed into cyst and approximately 15 cc's of clear, yellow fluid was aspirated from the cyst and posterior knee joint. Utilizing the same portal and ultrasound-guidance, a 2:2  bupivicaine:depo-medrol injectate was administered into the aspirated cyst space. Patient tolerated the procedure well without immediate complications.  Procedure, treatment alternatives, risks and benefits explained, specific risks discussed. Consent was given by the patient. Immediately prior to procedure a time out was called to verify the correct patient, procedure, equipment, support staff and site/side  marked as required. Patient was prepped and draped in the usual sterile fashion.          Clinical History: No specialty comments available.  She reports that she quit smoking about 11 months ago. Her smoking use included e-cigarettes and cigarettes. She has a 5.00 pack-year smoking history. She has never used smokeless tobacco. No results for input(s): "HGBA1C", "LABURIC" in the last 8760 hours.  Objective:   Vital Signs: There were no vitals taken for this visit.  Physical Exam  Gen: Well-appearing, in no acute distress; non-toxic CV: Well-perfused. Warm.  Resp: Breathing unlabored on room air; no wheezing. Psych: Fluid speech in conversation; appropriate affect; normal thought process Neuro: Sensation intact throughout. No gross coordination deficits.   Ortho Exam - Right knee: Evaluation of the knee shows a trace effusion of the knee.  There is positive TTP over the medial joint line.  Palpating the popliteal fossa there is a palpable mass which is mildly tender to palpation.  There is pain at endrange flexion with some mild restriction.  Imaging: [place] Extrem Low Right Ltd  Result Date: 11/07/2022 Limited musculoskeletal ultrasound of the right lower extremity, right knee was performed today.  Ultrasound demonstrates a small suprapatellar effusion.  Very small spur off the superior aspect of the patella without cortical defect.  Patellar tendon appears intact without evidence of tearing or hyperemia.  The medial joint line was evaluated with incompletely visualized meniscus.  I do not feel I appreciate a partial tear of the posterior horn of the medial meniscus which shows some calcification.  Possible for mild extrusion posteriorly.  Evaluation of the popliteal fossa shows a hypoechoic fluid structure, indicative of likely Baker's cyst.  This is at least 3.5 x 2.5 cm in nature.  There is no significant Doppler uptake or hyperemia surrounding.    *Baker's cyst^    Xray 10/02/22: 2  views of the right knee show no acute findings.  There is spurring off  of the superior patella at the insertion site of the quad tendon.  The  joint spaces well-maintained in both views.    Past Medical/Family/Surgical/Social History: Medications & Allergies reviewed per EMR, new medications updated. Patient Active Problem List   Diagnosis Date Noted   Menorrhagia 04/22/2022   Class 2 obesity due to excess calories with body mass index (BMI) of 36.0 to 36.9 in adult 06/10/2021   Mixed hyperlipidemia 08/25/2019   Former tobacco use 11/30/2017   Chronic back pain 09/24/2017   Headache 09/24/2017   Vitamin D deficiency 09/24/2017   Depression, recurrent    Hair loss 07/09/2012   Past Medical History:  Diagnosis Date   Anxiety    04/08/22 Pt states that she does not take medications or see a doctor for anxiety & depression.   Depression    04/08/22 Pt states that she does not take medications or see a doctor for anxiety or depression.   Dysmenorrhea    Pt follows with Dr. Gerald Leitz, LOV 01/23/22.   Headache    hx of headaches, usually occur w/ periods   Mixed hyperlipidemia    instructed by PCP to eat a healthy diet and exercise  Obesity (BMI 35.0-39.9 without comorbidity)    Pt follows w/ PCP Anne Ngharlotte Lum Nche, NP, LOV 03/13/22 in Epic.   Scoliosis    Patient had surgery when she was 42 years old.   Vitamin D deficiency 2019   Family History  Problem Relation Age of Onset   Diabetes Mother    Depression Mother    Cancer Father [phone]       prostate   Depression Maternal Aunt    Early death Paternal Aunt [phone]   Heart attack Paternal Aunt    Diabetes Maternal Grandmother    Early death Paternal Grandmother [phone]   Past Surgical History:  Procedure Laterality Date   BACK SURGERY  1996   Harrington Rods secondary to scoliosis   ROBOTIC ASSISTED TOTAL HYSTERECTOMY WITH BILATERAL SALPINGO OOPHERECTOMY N/A 04/22/2022   Procedure: XI ROBOTIC ASSISTED TOTAL HYSTERECTOMY WITH BILATERAL  SALPINGectomy;  Surgeon: Gerald Leitzole, Tara, MD;  [facility] [place];  Service: Gynecology;  Laterality: N/A;   Social History   Occupational History   Occupation: LPN  Tobacco Use   Smoking status: Former    Packs/day: 0.50    Years: 10.00    Additional pack years: 0.00    Total pack years: 5.00    Types: E-cigarettes, Cigarettes    Quit date: 12/06/2021    Years since quitting: 0.9   Smokeless tobacco: Never   Tobacco comments:    spoke newports, menthol  Vaping Use   Vaping Use: Never used  Substance and Sexual Activity   Alcohol use: Not Currently    Comment: social   Drug use: No   Sexual activity: Not on file

## 2022-11-07 NOTE — Patient Instructions (Addendum)
[employer] --> knee compression sleeve    - https://www.bodyhelix.com/products/full-knee-helix  The other one that is cheaper and still pretty good is Copper Fit Knee Sleeve  https://www.bodyhelix.com/products/full-knee-helix

## 2022-11-12 DIAGNOSIS — Z6832 Body mass index (BMI) 32.0-32.9, adult: Secondary | ICD-10-CM | POA: Diagnosis not present

## 2022-11-12 DIAGNOSIS — E7800 Pure hypercholesterolemia, unspecified: Secondary | ICD-10-CM | POA: Diagnosis not present

## 2022-11-12 DIAGNOSIS — E559 Vitamin D deficiency, unspecified: Secondary | ICD-10-CM | POA: Diagnosis not present

## 2022-11-12 DIAGNOSIS — R7303 Prediabetes: Secondary | ICD-10-CM | POA: Diagnosis not present

## 2022-11-12 DIAGNOSIS — E669 Obesity, unspecified: Secondary | ICD-10-CM | POA: Diagnosis not present

## 2022-11-27 ENCOUNTER — Other Ambulatory Visit (HOSPITAL_COMMUNITY): Payer: Self-pay

## 2022-12-16 ENCOUNTER — Other Ambulatory Visit (HOSPITAL_COMMUNITY): Payer: Self-pay

## 2022-12-16 DIAGNOSIS — E7800 Pure hypercholesterolemia, unspecified: Secondary | ICD-10-CM | POA: Diagnosis not present

## 2022-12-16 DIAGNOSIS — Z6832 Body mass index (BMI) 32.0-32.9, adult: Secondary | ICD-10-CM | POA: Diagnosis not present

## 2022-12-16 DIAGNOSIS — R7303 Prediabetes: Secondary | ICD-10-CM | POA: Diagnosis not present

## 2022-12-16 DIAGNOSIS — E669 Obesity, unspecified: Secondary | ICD-10-CM | POA: Diagnosis not present

## 2022-12-16 DIAGNOSIS — E559 Vitamin D deficiency, unspecified: Secondary | ICD-10-CM | POA: Diagnosis not present

## 2022-12-16 MED ORDER — LOMAIRA 8 MG PO TABS
8.0000 mg | ORAL_TABLET | Freq: Every day | ORAL | 0 refills | Status: DC
  Filled 2022-12-16 – 2023-01-28 (×2): qty 30, 30d supply, fill #0

## 2022-12-30 ENCOUNTER — Other Ambulatory Visit (HOSPITAL_COMMUNITY): Payer: Self-pay

## 2023-01-28 ENCOUNTER — Other Ambulatory Visit (HOSPITAL_COMMUNITY): Payer: Self-pay

## 2023-01-29 ENCOUNTER — Other Ambulatory Visit: Payer: Self-pay

## 2023-06-03 ENCOUNTER — Encounter: Payer: Self-pay | Admitting: Nurse Practitioner

## 2023-06-03 ENCOUNTER — Ambulatory Visit (INDEPENDENT_AMBULATORY_CARE_PROVIDER_SITE_OTHER): Payer: Commercial Managed Care - PPO | Admitting: Nurse Practitioner

## 2023-06-03 VITALS — BP 120/80 | HR 70 | Temp 98.2°F | Resp 18 | Ht 66.0 in | Wt 226.6 lb

## 2023-06-03 DIAGNOSIS — E782 Mixed hyperlipidemia: Secondary | ICD-10-CM | POA: Diagnosis not present

## 2023-06-03 DIAGNOSIS — E66812 Obesity, class 2: Secondary | ICD-10-CM | POA: Diagnosis not present

## 2023-06-03 DIAGNOSIS — M25551 Pain in right hip: Secondary | ICD-10-CM | POA: Diagnosis not present

## 2023-06-03 DIAGNOSIS — Z23 Encounter for immunization: Secondary | ICD-10-CM | POA: Diagnosis not present

## 2023-06-03 DIAGNOSIS — Z0001 Encounter for general adult medical examination with abnormal findings: Secondary | ICD-10-CM

## 2023-06-03 DIAGNOSIS — Z6836 Body mass index (BMI) 36.0-36.9, adult: Secondary | ICD-10-CM

## 2023-06-03 DIAGNOSIS — Z1231 Encounter for screening mammogram for malignant neoplasm of breast: Secondary | ICD-10-CM | POA: Diagnosis not present

## 2023-06-03 DIAGNOSIS — E559 Vitamin D deficiency, unspecified: Secondary | ICD-10-CM | POA: Diagnosis not present

## 2023-06-03 DIAGNOSIS — F339 Major depressive disorder, recurrent, unspecified: Secondary | ICD-10-CM

## 2023-06-03 LAB — LIPID PANEL
Cholesterol: 228 mg/dL — ABNORMAL HIGH (ref 0–200)
HDL: 51 mg/dL (ref >39.00)
LDL Cholesterol: 153 mg/dL — ABNORMAL HIGH (ref 0–99)
NonHDL: 177
Total CHOL/HDL Ratio: 4
Triglycerides: 120 mg/dL (ref 0.0–149.0)
VLDL: 24 mg/dL (ref 0.0–40.0)

## 2023-06-03 LAB — COMPREHENSIVE METABOLIC PANEL
ALT: 11 U/L (ref 0–35)
AST: 15 U/L (ref 0–37)
Albumin: 4 g/dL (ref 3.5–5.2)
Alkaline Phosphatase: 74 U/L (ref 39–117)
BUN: 8 mg/dL (ref 6–23)
CO2: 32 meq/L (ref 19–32)
Calcium: 9.6 mg/dL (ref 8.4–10.5)
Chloride: 101 meq/L (ref 96–112)
Creatinine, Ser: 0.77 mg/dL (ref 0.40–1.20)
GFR: 96.53 mL/min (ref >60.00)
Glucose, Bld: 107 mg/dL — ABNORMAL HIGH (ref 70–99)
Potassium: 4.7 meq/L (ref 3.5–5.1)
Sodium: 139 meq/L (ref 135–145)
Total Bilirubin: 0.5 mg/dL (ref 0.2–1.2)
Total Protein: 7.1 g/dL (ref 6.0–8.3)

## 2023-06-03 LAB — VITAMIN D 25 HYDROXY (VIT D DEFICIENCY, FRACTURES): VITD: 30.79 ng/mL (ref 30.00–100.00)

## 2023-06-03 MED ORDER — MELOXICAM 15 MG PO TABS: 15.0000 mg | ORAL_TABLET | Freq: Every day | ORAL | 0 refills | Status: DC

## 2023-06-03 MED ORDER — CYCLOBENZAPRINE HCL 5 MG PO TABS: 5.0000 mg | ORAL_TABLET | Freq: Every day | ORAL | 0 refills | Status: DC

## 2023-06-03 NOTE — Assessment & Plan Note (Signed)
Repeat lipid panel Advised about importance of heart healthy diet.

## 2023-06-03 NOTE — Assessment & Plan Note (Signed)
Stable mood No need for medication of counseling

## 2023-06-03 NOTE — Patient Instructions (Signed)
Go to lab maintain Heart healthy diet and daily exercise (low impact). Start hip exercise Send message if no improvement in 2weeks  Hip Exercises Ask your health care provider which exercises are safe for you. Do exercises exactly as told by your provider and adjust them as told. It is normal to feel mild stretching, pulling, tightness, or discomfort as you do these exercises. Stop right away if you feel sudden pain or your pain gets worse. Do not begin these exercises until told by your provider. Stretching and range-of-motion exercises These exercises warm up your muscles and joints and improve the movement and flexibility of your hip. They also help to relieve pain, numbness, and tingling. You may be asked to limit your range of motion if you had a hip replacement. Talk to your provider about these limits. Hamstrings, supine  Lie on your back (supine position). Loop a belt, towel, or exercise band over the ball of your left / right foot. The ball of your foot is on the walking surface, right under your toes. Straighten your left / right knee and slowly pull on the belt, towel, or band to raise your leg until you feel a gentle stretch behind your knee (hamstring). Do not let your knee bend while you do this. Keep your other leg flat on the floor. Hold this position for __________ seconds. Slowly return your leg to the starting position. Repeat __________ times. Complete this exercise __________ times a day. Hip rotation  Lie on your back on a firm surface. With your left / right hand, gently pull your left / right knee toward the shoulder that is on the same side of the body. Stop when your knee is pointing toward the ceiling. Hold your left / right ankle with your other hand. Keeping your knee steady, gently pull your left / right ankle toward your other shoulder until you feel a stretch in your butt. Keep your hips and shoulders firmly planted while you do this stretch. Hold this  position for __________ seconds. Repeat __________ times. Complete this exercise __________ times a day. Seated stretch This exercise is sometimes called hamstrings and adductors stretch. Sit on the floor with your legs stretched wide. Keep your knees straight during this exercise. Keeping your head and back in a straight line, bend at your waist to reach for your left foot (position A). You should feel a stretch in your right inner thigh (adductors). Hold this position for __________ seconds. Then slowly return to the upright position. Keeping your head and back in a straight line, bend at your waist to reach forward (position B). You should feel a stretch behind both of your thighs and knees (hamstrings). Hold this position for __________ seconds. Then slowly return to the upright position. Keeping your head and back in a straight line, bend at your waist to reach for your right foot (position C). You should feel a stretch in your left inner thigh (adductors). Hold this position for __________ seconds. Then slowly return to the upright position. Repeat __________ times. Complete this exercise __________ times a day. Lunge This exercise stretches the muscles of the hip (hip flexors). Place your left / right knee on the floor and bend your other knee so that is directly over your ankle. You should be half-kneeling. Keep good posture with your head over your shoulders. Tighten your butt muscles to point your tailbone downward. This will prevent your back from arching too much. You should feel a gentle stretch in the  front of your left / right thigh and hip. If you do not feel a stretch, slide your other foot forward slightly and then slowly lunge forward with your chest up until your knee once again lines up over your ankle. Make sure your tailbone continues to point downward. Hold this position for __________ seconds. Slowly return to the starting position. Repeat __________ times. Complete this  exercise __________ times a day. Strengthening exercises These exercises build strength and endurance in your hip. Endurance is the ability to use your muscles for a long time, even after they get tired. Bridge This exercise strengthens the muscles of your hip (hip extensors). Lie on your back on a firm surface with your knees bent and your feet flat on the floor. Tighten your butt muscles and lift your bottom off the floor until the trunk of your body and your hips are level with your thighs. Do not arch your back. You should feel the muscles working in your butt and the back of your thighs. If you do not feel these muscles, slide your feet 1-2 inches (2.5-5 cm) farther away from your butt. Hold this position for __________ seconds. Slowly lower your hips to the starting position. Let your muscles relax completely between repetitions. Repeat __________ times. Complete this exercise __________ times a day. Straight leg raises, side-lying This exercise strengthens the muscles that move the hip joint away from the center of the body (hip abductors). Lie on your side with your left / right leg in the top position. Lie so your head, shoulder, hip, and knee line up. You may bend your bottom knee slightly to help you balance. Roll your hips slightly forward, so your hips are stacked directly over each other and your left / right knee is facing forward. Leading with your heel, lift your top leg 4-6 inches (10-15 cm). You should feel the muscles in your top hip lifting. Do not let your foot drift forward. Do not let your knee roll toward the ceiling. Hold this position for __________ seconds. Slowly return to the starting position. Let your muscles relax completely between repetitions. Repeat __________ times. Complete this exercise __________ times a day. Straight leg raises, side-lying This exercise strengthens the muscles that move the hip joint toward the center of the body (hip adductors). Lie  on your side with your left / right leg in the bottom position. Lie so your head, shoulder, hip, and knee line up. You may place your upper foot in front to help you balance. Roll your hips slightly forward, so your hips are stacked directly over each other and your left / right knee is facing forward. Tense the muscles in your inner thigh and lift your bottom leg 4-6 inches (10-15 cm). Hold this position for __________ seconds. Slowly return to the starting position. Let your muscles relax completely between repetitions. Repeat __________ times. Complete this exercise __________ times a day. Straight leg raises, supine This exercise strengthens the muscles in the front of your thigh (quadriceps and hip flexors). Lie on your back (supine position) with your left / right leg extended and your other knee bent. Tense the muscles in the front of your left / right thigh. You should see your kneecap slide up or see increased dimpling just above your knee. Keep these muscles tight as you raise your leg 4-6 inches (10-15 cm) off the floor. Do not let your knee bend. Hold this position for __________ seconds. Keep these muscles tense as you lower your  leg. Relax the muscles slowly and completely between repetitions. Repeat __________ times. Complete this exercise __________ times a day. Hip abductors, standing This exercise strengthens the muscles that move the leg and hip joint away from the center of the body (hip abductors). Tie one end of a rubber exercise band or tubing to a secure surface, such as a chair, table, or pole. Loop the other end of the band or tubing around your left / right ankle. Keeping your ankle with the band or tubing directly opposite the secured end, step away until there is tension in the tubing or band. Hold on to a chair, table, or pole as needed for balance. Lift your left / right leg out to your side. While you do this: Keep your back upright. Keep your shoulders over  your hips. Keep your toes pointing forward. Make sure to use your hip muscles to slowly lift your leg. Do not tip your body or forcefully lift your leg. Hold this position for __________ seconds. Slowly return to the starting position. Repeat __________ times. Complete this exercise __________ times a day. Squats This exercise strengthens the muscles in the front of your thigh (quadriceps). Stand in front of a table, or stand in a doorframe so your feet and knees are in line with the frame. You may place your hands on the table or frame for balance. Slowly bend your knees and lower your hips like you are going to sit in a chair. Keep your lower legs in a straight up-and-down position. Do not let your hips go lower than your knees. Do not bend your knees lower than told by your provider. If your hip pain increases, do not bend as low. Hold this position for ___________ seconds. Slowly push with your legs to return to standing. Do not use your hands to pull yourself to standing. Repeat __________ times. Complete this exercise __________ times a day. This information is not intended to replace advice given to you by your health care provider. Make sure you discuss any questions you have with your health care provider. Document Revised: 03/25/2022 Document Reviewed: 03/25/2022 Elsevier Patient Education  2024 [employer].

## 2023-06-03 NOTE — Assessment & Plan Note (Signed)
Current use of OVER THE COUNTER dose 5000IU daily Repeat vit. D

## 2023-06-03 NOTE — Assessment & Plan Note (Signed)
Unable to afford GLP-1 injection Unable to tolerate phentermine (palpitation and dry mouth) Wt Readings from Last 3 Encounters:  06/03/23 226 lb 9.6 oz (102.8 kg)  04/22/22 225 lb 8 oz (102.3 kg)  04/18/22 222 lb 8 oz (100.9 kg)    Encouraged to maintain heart healthy diet and daily exercise

## 2023-06-03 NOTE — Progress Notes (Signed)
Complete physical exam  Patient: Caitlin Foster   DOB: Dec 11, 1980   42 y.o. Female  MRN: [id number] Visit Date: 06/03/2023  Subjective:    Chief Complaint  Patient presents with   Annual Exam    PT is here for annual exam     Caitlin Foster is a 42 y.o. female who presents today for a complete physical exam. She reports consuming a general diet.  Walking 2-3x/week  She generally feels well. She reports sleeping well. She does have additional problems to discuss today.  Vision:Yes Dental:No STD Screen:No BP Readings from Last 3 Encounters:  06/03/23 120/80  04/23/22 126/69  04/18/22 (!) 146/86   Wt Readings from Last 3 Encounters:  06/03/23 226 lb 9.6 oz (102.8 kg)  04/22/22 225 lb 8 oz (102.3 kg)  04/18/22 222 lb 8 oz (100.9 kg)   Most recent fall risk assessment:    06/03/2023    8:17 AM  Fall Risk   Falls in the past year? 0  Number falls in past yr: 1  Injury with Fall? 0  Risk for fall due to : No Fall Risks  Follow up Falls evaluation completed     Depression screen:Yes - Depression Most recent depression screenings:    06/03/2023    8:17 AM 03/13/2022    1:33 PM  PHQ 2/9 Scores  PHQ - 2 Score 2 1  PHQ- 9 Score 4 2    Hip Pain  The incident occurred more than 1 week ago. There was no injury mechanism. The pain is present in the right hip. The quality of the pain is described as aching. The pain is moderate. The pain has been Constant since onset. Pertinent negatives include no inability to bear weight, loss of motion, loss of sensation, muscle weakness, numbness or tingling. She reports no foreign bodies present. The symptoms are aggravated by movement and palpation. She has tried NSAIDs for the symptoms. The treatment provided mild relief.    Vitamin D deficiency Current use of OVER THE COUNTER dose 5000IU daily Repeat vit. D  Mixed hyperlipidemia Repeat lipid panel Advised about importance of heart healthy diet  Depression, recurrent  (HCC) Stable mood No need for medication of counseling  Class 2 severe obesity due to excess calories with serious comorbidity and body mass index (BMI) of 36.0 to 36.9 in adult [facility]) Unable to afford GLP-1 injection Unable to tolerate phentermine (palpitation and dry mouth) Wt Readings from Last 3 Encounters:  06/03/23 226 lb 9.6 oz (102.8 kg)  04/22/22 225 lb 8 oz (102.3 kg)  04/18/22 222 lb 8 oz (100.9 kg)    Encouraged to maintain heart healthy diet and daily exercise   Past Medical History:  Diagnosis Date   Anxiety    04/08/22 Pt states that she does not take medications or see a doctor for anxiety & depression.   Depression    04/08/22 Pt states that she does not take medications or see a doctor for anxiety or depression.   Dysmenorrhea    Pt follows with Dr. Gerald Leitz, LOV 01/23/22.   Headache    hx of headaches, usually occur w/ periods   Mixed hyperlipidemia    instructed by PCP to eat a healthy diet and exercise   Obesity (BMI 35.0-39.9 without comorbidity)    Pt follows w/ PCP Anne Ng, NP, LOV 03/13/22 in Epic.   Scoliosis    Patient had surgery when she was 42 years old.   Vitamin D  deficiency 2019   Past Surgical History:  Procedure Laterality Date   ABDOMINAL HYSTERECTOMY  04/22/2022   BACK SURGERY  1996   Harrington Rods secondary to scoliosis   ROBOTIC ASSISTED TOTAL HYSTERECTOMY WITH BILATERAL SALPINGO OOPHERECTOMY N/A 04/22/2022   Procedure: XI ROBOTIC ASSISTED TOTAL HYSTERECTOMY WITH BILATERAL SALPINGectomy;  Surgeon: Gerald Leitz, MD;  Location: [facility];  Service: Gynecology;  Laterality: N/A;   Social History   Socioeconomic History   Marital status: Single    Spouse name: Not on file   Number of children: 2   Years of education: Not on file   Highest education level: Bachelor's degree (e.g., BA, AB, BS)  Occupational History   Occupation: LPN  Tobacco Use   Smoking status: Former    Current packs/day: 0.00    Average  packs/day: 0.5 packs/day for 10.0 years (5.0 ttl pk-yrs)    Types: E-cigarettes, Cigarettes    Start date: 12/07/2011    Quit date: 12/06/2021    Years since quitting: 1.4   Smokeless tobacco: Never   Tobacco comments:    spoke newports, menthol  Vaping Use   Vaping status: Never Used  Substance and Sexual Activity   Alcohol use: Not Currently    Comment: social   Drug use: No   Sexual activity: Not on file  Other Topics Concern   Not on file  Social History Narrative   Not on file   Social Determinants of Health   Financial Resource Strain: Low Risk  (06/02/2023)   Overall Financial Resource Strain (CARDIA)    Difficulty of Paying Living Expenses: Not very hard  Food Insecurity: No Food Insecurity (06/02/2023)   Hunger Vital Sign    Worried About Running Out of Food in the Last Year: Never true    Ran Out of Food in the Last Year: Never true  Transportation Needs: No Transportation Needs (06/02/2023)   PRAPARE - Administrator, Civil Service (Medical): No    Lack of Transportation (Non-Medical): No  Physical Activity: Insufficiently Active (06/02/2023)   Exercise Vital Sign    Days of Exercise per Week: 2 days    Minutes of Exercise per Session: 30 min  Stress: No Stress Concern Present (06/02/2023)   [employer] of Occupational Health - Occupational Stress Questionnaire    Feeling of Stress : Only a little  Social Connections: Moderately Isolated (06/02/2023)   Social Connection and Isolation Panel [NHANES]    Frequency of Communication with Friends and Family: More than three times a week    Frequency of Social Gatherings with Friends and Family: Once a week    Attends Religious Services: More than 4 times per year    Active Member of [employer] or Organizations: No    Attends Engineer, structural: Not on file    Marital Status: Never married  Intimate Partner Violence: Not on file   Family Status  Relation Name Status   Mother  Alive   Father   Alive   Mat Aunt  (Not Specified)   Pat Aunt  Deceased   MGM  (Not Specified)   PGM  Deceased  No partnership data on file   Family History  Problem Relation Age of Onset   Diabetes Mother    Depression Mother    Cancer Father 88       prostate   Depression Maternal Aunt    Early death Paternal Aunt 50   Heart attack Paternal Aunt  Diabetes Maternal Grandmother    Early death Paternal Grandmother 30   Allergies  Allergen Reactions   Levaquin  [Levofloxacin In D5w] Itching   Levofloxacin Hives and Itching   Sulfa Antibiotics Rash   Sulfamethoxazole-Trimethoprim Rash    Patient Care Team: Murdis Flitton, Bonna Gains, NP as PCP - General (Internal Medicine)   Medications: Outpatient Medications Prior to Visit  Medication Sig   acetaminophen (TYLENOL) 500 MG tablet Take 2 tablets (1,000 mg total) by mouth every 8 (eight) hours as needed.   ELDERBERRY PO Take by mouth.   Multiple Vitamins-Minerals (MULTIVITAMIN WITH MINERALS) tablet Take 1 tablet by mouth daily.   ondansetron (ZOFRAN) 8 MG tablet 1 tablet as needed Orally Once a day for 30 day(s)   Vitamin D, Ergocalciferol, (DRISDOL) 1.25 MG (50000 UNIT) CAPS capsule Take 1 capsule (50,000 Units total) by mouth every 7 (seven) days.   [DISCONTINUED] ibuprofen (ADVIL) 600 MG tablet Take 1 tablet (600 mg total) by mouth every 6 (six) hours as needed for mild pain, moderate pain or cramping.   [DISCONTINUED] Phentermine HCl (LOMAIRA) 8 MG TABS Take 1 tablet (8 mg total) by mouth daily 30 minutes before breakfast.   [DISCONTINUED] diclofenac (VOLTAREN) 75 MG EC tablet Take 1 tablet (75 mg total) by mouth 2 (two) times daily between meals as needed. (Patient not taking: Reported on 06/03/2023)   [DISCONTINUED] Insulin Pen Needle (BD PEN NEEDLE NANO U/F) 32G X 4 MM MISC Use as  directed   [DISCONTINUED] Liraglutide -Weight Management (SAXENDA) 18 MG/3ML SOPN Inject 0.6 mg into the skin daily and titrate up to 3 mg as directed    [DISCONTINUED] oxyCODONE (OXY IR/ROXICODONE) 5 MG immediate release tablet Take 1-2 tablets (5-10 mg total) by mouth every 4 (four) hours as needed for moderate pain. (Patient not taking: Reported on 10/22/2022)   [DISCONTINUED] Semaglutide-Weight Management (WEGOVY) 1 MG/0.5ML SOAJ Inject 1 mg into the skin once a week (Patient not taking: Reported on 10/22/2022)   [DISCONTINUED] Semaglutide-Weight Management (WEGOVY) 1.7 MG/0.75ML SOAJ Inject 1.7 mg into the skin once a week. (Patient not taking: Reported on 10/22/2022)   [DISCONTINUED] Semaglutide-Weight Management (WEGOVY) 2.4 MG/0.75ML SOAJ Inject 2.4 mg into the skin once a week. (Patient not taking: Reported on 06/03/2023)   No facility-administered medications prior to visit.    Review of Systems  Constitutional:  Negative for activity change, appetite change and unexpected weight change.  Respiratory: Negative.    Cardiovascular: Negative.   Gastrointestinal: Negative.   Endocrine: Negative for cold intolerance and heat intolerance.  Genitourinary: Negative.   Musculoskeletal:  Positive for arthralgias. Negative for back pain, gait problem, joint swelling, myalgias, neck pain and neck stiffness.  Skin: Negative.   Neurological: Negative.  Negative for tingling and numbness.  Hematological: Negative.   Psychiatric/Behavioral:  Negative for behavioral problems, decreased concentration, dysphoric mood, hallucinations, self-injury, sleep disturbance and suicidal ideas. The patient is not nervous/anxious.         Objective:  BP 120/80   Pulse 70   Temp 98.2 F (36.8 C) (Temporal)   Resp 18   Ht 5\' 6"  (1.676 m)   Wt 226 lb 9.6 oz (102.8 kg)   SpO2 99%   BMI 36.57 kg/m     Physical Exam Vitals and nursing note reviewed.  Constitutional:      General: She is not in acute distress. HENT:     Right Ear: Tympanic membrane, ear canal and external ear normal.     Left Ear: Tympanic membrane, ear  canal and external ear normal.      Nose: Nose normal.  Eyes:     Extraocular Movements: Extraocular movements intact.     Conjunctiva/sclera: Conjunctivae normal.     Pupils: Pupils are equal, round, and reactive to light.  Neck:     Thyroid: No thyroid mass, thyromegaly or thyroid tenderness.  Cardiovascular:     Rate and Rhythm: Normal rate and regular rhythm.     Pulses: Normal pulses.     Heart sounds: Normal heart sounds.  Pulmonary:     Effort: Pulmonary effort is normal.     Breath sounds: Normal breath sounds.  Abdominal:     General: Bowel sounds are normal.     Palpations: Abdomen is soft.  Musculoskeletal:     Cervical back: Normal range of motion and neck supple.     Lumbar back: Normal.     Right hip: Tenderness present. No crepitus. Normal range of motion. Normal strength.     Left hip: Normal.     Right upper leg: Normal.     Left upper leg: Normal.     Right knee: Normal.     Left knee: Normal.     Right lower leg: No edema.     Left lower leg: No edema.  Lymphadenopathy:     Cervical: No cervical adenopathy.  Skin:    General: Skin is warm and dry.  Neurological:     Mental Status: She is alert and oriented to person, place, and time.     Cranial Nerves: No cranial nerve deficit.  Psychiatric:        Mood and Affect: Mood normal.        Behavior: Behavior normal.        Thought Content: Thought content normal.      No results found for any visits on 06/03/23.    Assessment & Plan:    Routine Health Maintenance and Physical Exam  Immunization History  Administered Date(s) Administered   Influenza,inj,Quad PF,6+ Mos 10/06/2017   Influenza-Unspecified 08/27/2017, 05/04/2019, 05/04/2023   PFIZER(Purple Top)SARS-COV-2 Vaccination 02/28/2020, 03/20/2020, 10/22/2020   Tdap 06/03/2023   Health Maintenance  Topic Date Due   COVID-19 Vaccine (4 - 2023-24 season) 10/27/2023 (Originally 04/05/2023)   Hepatitis C Screening  06/02/2024 (Originally 01/19/2001)   DTaP/Tdap/Td (2 - Td or Tdap)  06/02/2033   INFLUENZA VACCINE  Completed   HIV Screening  Completed   HPV VACCINES  Aged Out   Discussed health benefits of physical activity, and encouraged her to engage in regular exercise appropriate for her age and condition.  Problem List Items Addressed This Visit     Class 2 severe obesity due to excess calories with serious comorbidity and body mass index (BMI) of 36.0 to 36.9 in adult [facility])    Unable to afford GLP-1 injection Unable to tolerate phentermine (palpitation and dry mouth) Wt Readings from Last 3 Encounters:  06/03/23 226 lb 9.6 oz (102.8 kg)  04/22/22 225 lb 8 oz (102.3 kg)  04/18/22 222 lb 8 oz (100.9 kg)    Encouraged to maintain heart healthy diet and daily exercise      Depression, recurrent (HCC)    Stable mood No need for medication of counseling      Mixed hyperlipidemia    Repeat lipid panel Advised about importance of heart healthy diet      Relevant Orders   Lipid panel   Vitamin D deficiency    Current use of OVER THE COUNTER dose  5000IU daily Repeat vit. D      Relevant Orders   VITAMIN D 25 Hydroxy (Vit-D Deficiency, Fractures)   Other Visit Diagnoses     Encounter for preventative adult health care exam with abnormal findings    -  Primary   Relevant Orders   Comprehensive metabolic panel   Breast cancer screening by mammogram       Relevant Orders   MM 3D SCREENING MAMMOGRAM BILATERAL BREAST   Immunization due       Relevant Orders   Tdap vaccine greater than or equal to 7yo IM (Completed)   Acute right hip pain       Relevant Medications   meloxicam (MOBIC) 15 MG tablet   cyclobenzaprine (FLEXERIL) 5 MG tablet     Advised to Start hip exercise, use cold compress as needed, avoid other NSAIDs while taking meloxicam, ok alternate to take with tylenol OVER THE COUNTER prn Send message if no improvement in 2weeks  Return in about 1 year (around 06/02/2024) for CPE (fasting).     Alysia Penna, NP

## 2023-06-05 ENCOUNTER — Ambulatory Visit
Admission: RE | Admit: 2023-06-05 | Discharge: 2023-06-05 | Disposition: A | Discharge status: Home / Self Care | Payer: Commercial Managed Care - PPO | Source: Ambulatory Visit | Attending: Nurse Practitioner

## 2023-06-05 DIAGNOSIS — Z1231 Encounter for screening mammogram for malignant neoplasm of breast: Secondary | ICD-10-CM | POA: Diagnosis not present

## 2023-06-19 ENCOUNTER — Encounter: Payer: Self-pay | Admitting: Nurse Practitioner

## 2023-06-19 DIAGNOSIS — M25551 Pain in right hip: Secondary | ICD-10-CM

## 2023-06-24 ENCOUNTER — Ambulatory Visit (INDEPENDENT_AMBULATORY_CARE_PROVIDER_SITE_OTHER)
Admission: RE | Admit: 2023-06-24 | Discharge: 2023-06-24 | Disposition: A | Discharge status: Home / Self Care | Payer: Commercial Managed Care - PPO | Source: Ambulatory Visit | Attending: Nurse Practitioner | Admitting: Nurse Practitioner

## 2023-06-24 DIAGNOSIS — M160 Bilateral primary osteoarthritis of hip: Secondary | ICD-10-CM | POA: Diagnosis not present

## 2023-06-24 DIAGNOSIS — M25551 Pain in right hip: Secondary | ICD-10-CM

## 2023-06-24 DIAGNOSIS — Z981 Arthrodesis status: Secondary | ICD-10-CM | POA: Diagnosis not present

## 2023-07-13 ENCOUNTER — Other Ambulatory Visit: Payer: Self-pay | Admitting: Nurse Practitioner

## 2023-07-13 DIAGNOSIS — M25551 Pain in right hip: Secondary | ICD-10-CM

## 2023-07-13 DIAGNOSIS — M1611 Unilateral primary osteoarthritis, right hip: Secondary | ICD-10-CM

## 2023-07-14 MED ORDER — MELOXICAM 15 MG PO TABS: 15.0000 mg | ORAL_TABLET | Freq: Every day | ORAL | 0 refills | Status: DC

## 2023-07-14 NOTE — Telephone Encounter (Signed)
Requesting: meloxicam (MOBIC) 15 MG tablet  Last Visit: 06/03/2023 Next Visit: Visit date not found Last Refill: 06/03/2023  Please Advise

## 2023-08-04 DIAGNOSIS — Z01419 Encounter for gynecological examination (general) (routine) without abnormal findings: Secondary | ICD-10-CM | POA: Diagnosis not present

## 2023-11-08 ENCOUNTER — Encounter (HOSPITAL_COMMUNITY): Payer: Self-pay | Admitting: *Deleted

## 2023-11-08 ENCOUNTER — Emergency Department (HOSPITAL_COMMUNITY)

## 2023-11-08 ENCOUNTER — Other Ambulatory Visit: Payer: Self-pay

## 2023-11-08 ENCOUNTER — Emergency Department (HOSPITAL_COMMUNITY)
Admission: EM | Admit: 2023-11-08 | Discharge: 2023-11-08 | Disposition: A | Discharge status: Home / Self Care | Attending: Emergency Medicine | Admitting: Emergency Medicine

## 2023-11-08 DIAGNOSIS — R0789 Other chest pain: Secondary | ICD-10-CM | POA: Insufficient documentation

## 2023-11-08 DIAGNOSIS — R918 Other nonspecific abnormal finding of lung field: Secondary | ICD-10-CM | POA: Diagnosis not present

## 2023-11-08 DIAGNOSIS — R079 Chest pain, unspecified: Secondary | ICD-10-CM | POA: Diagnosis not present

## 2023-11-08 LAB — BASIC METABOLIC PANEL WITH GFR
Anion gap: 12 (ref 5–15)
BUN: 11 mg/dL (ref 6–20)
CO2: 25 mmol/L (ref 22–32)
Calcium: 9.5 mg/dL (ref 8.9–10.3)
Chloride: 104 mmol/L (ref 98–111)
Creatinine, Ser: 0.93 mg/dL (ref 0.44–1.00)
GFR, Estimated: >60 mL/min (ref >60)
Glucose, Bld: 96 mg/dL (ref 70–99)
Potassium: 3.8 mmol/L (ref 3.5–5.1)
Sodium: 141 mmol/L (ref 135–145)

## 2023-11-08 LAB — CBC
HCT: 36.7 % (ref 36.0–46.0)
Hemoglobin: 11.9 g/dL — ABNORMAL LOW (ref 12.0–15.0)
MCH: 28.4 pg (ref 26.0–34.0)
MCHC: 32.4 g/dL (ref 30.0–36.0)
MCV: 87.6 fL (ref 80.0–100.0)
Platelets: 376 10*3/uL (ref 150–400)
RBC: 4.19 MIL/uL (ref 3.87–5.11)
RDW: 12.9 % (ref 11.5–15.5)
WBC: 8.1 10*3/uL (ref 4.0–10.5)
nRBC: 0 % (ref 0.0–0.2)

## 2023-11-08 LAB — TROPONIN I (HIGH SENSITIVITY)
Troponin I (High Sensitivity): 3 ng/L (ref <18)
Troponin I (High Sensitivity): 3 ng/L (ref <18)

## 2023-11-08 NOTE — ED Provider Notes (Signed)
[place] EMERGENCY DEPARTMENT AT [facility] Provider Note   CSN: [id number] Arrival date & time: 11/08/23  0142     History  Chief Complaint  Patient presents with   Chest Pain    Caitlin Foster is a 43 y.o. female with medical history significant for dysmenorrhea, depression, anxiety, scoliosis, chronic back pain.  Patient presents to ED for evaluation of left-sided chest pain.  States that pain has been present for 1 to 2 weeks.  Reports that her in the last few days the pain is intensified and worsened.  Reports that episodes of chest pain are more frequent.  Denies radiation of chest pain, denies worsening with exertion.  Denies any shortness of breath, abdominal pain, nausea, vomiting, lightheadedness, dizziness, weakness, syncope, leg swelling.  Denies any recent surgery or travel, history of DVT or PE, exogenous hormone use, cough, unilateral leg swelling.  Reports that she is been taking ibuprofen at home which will always help to alleviate the pain but the pain returns.  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  States that she can push on her chest and that reproduces the pain.  States that pain is worse with taking deep breaths.   Chest Pain Associated symptoms: no shortness of breath        Home Medications Prior to Admission medications   Medication Sig Start Date End Date Taking? Authorizing Provider  acetaminophen (TYLENOL) 500 MG tablet Take 2 tablets (1,000 mg total) by mouth every 8 (eight) hours as needed. 04/22/22   Gerald Leitz, MD  cyclobenzaprine (FLEXERIL) 5 MG tablet Take 1 tablet (5 mg total) by mouth at bedtime. 06/03/23   Nche, Bonna Gains, NP  ELDERBERRY PO Take by mouth.    [provider]  meloxicam (MOBIC) 15 MG tablet Take 1 tablet (15 mg total) by mouth daily. 07/14/23   Nche, Bonna Gains, NP  Multiple Vitamins-Minerals (MULTIVITAMIN WITH MINERALS) tablet Take 1 tablet by mouth daily.  06/10/21   Nche, Bonna Gains, NP  ondansetron (ZOFRAN) 8 MG tablet 1 tablet as needed Orally Once a day for 30 day(s) 06/18/22   [provider]  Vitamin D, Ergocalciferol, (DRISDOL) 1.25 MG (50000 UNIT) CAPS capsule Take 1 capsule (50,000 Units total) by mouth every 7 (seven) days. 03/17/22   Nche, Bonna Gains, NP      Allergies    Levaquin  [levofloxacin in d5w], Levofloxacin, Sulfa antibiotics, and Sulfamethoxazole-trimethoprim    Review of Systems   Review of Systems  Respiratory:  Negative for shortness of breath.   Cardiovascular:  Positive for chest pain.    Physical Exam Updated Vital Signs BP 131/81   Pulse 64   Temp 97.6 F (36.4 C)   Resp 18   Ht 5\' 6"  (1.676 m)   Wt 102.8 kg   LMP 04/11/2022 (Exact Date)   SpO2 97%   BMI 36.58 kg/m  Physical Exam Vitals and nursing note reviewed.  Constitutional:      General: She is not in acute distress.    Appearance: She is well-developed.  HENT:     Head: Normocephalic and atraumatic.  Eyes:     Conjunctiva/sclera: Conjunctivae normal.  Cardiovascular:     Rate and Rhythm: Normal rate and regular rhythm.     Heart sounds: No murmur heard. Pulmonary:     Effort: Pulmonary effort is normal. No respiratory distress.     Breath sounds: Normal breath sounds.  Chest:  Chest wall: Tenderness present.  Abdominal:     Palpations: Abdomen is soft.     Tenderness: There is no abdominal tenderness.  Musculoskeletal:        General: No swelling.     Cervical back: Neck supple.     Right lower leg: No edema.     Left lower leg: No edema.  Skin:    General: Skin is warm and dry.     Capillary Refill: Capillary refill takes less than 2 seconds.  Neurological:     Mental Status: She is alert and oriented to person, place, and time.  Psychiatric:        Mood and Affect: Mood normal.     ED Results / Procedures / Treatments   Labs (all labs ordered are listed, but only abnormal results are displayed) Labs  Reviewed  CBC - Abnormal; Notable for the following components:      Result Value   Hemoglobin 11.9 (*)    All other components within normal limits  BASIC METABOLIC PANEL WITH GFR  TROPONIN I (HIGH SENSITIVITY)  TROPONIN I (HIGH SENSITIVITY)    EKG None  Radiology DG Chest 2 View Result Date: 11/08/2023 CLINICAL DATA:  Chest pain. EXAM: CHEST - 2 VIEW COMPARISON:  October 26, 2014 FINDINGS: The heart size and mediastinal contours are within normal limits. Mild atelectasis and/or infiltrate is seen within the right lung base. No pleural effusion or pneumothorax is identified. There is stable marked severity dextroscoliosis of the thoracic spine with stable Harrington rods in place. IMPRESSION: Mild right basilar atelectasis and/or infiltrate. Electronically Signed   By: Aram Candela M.D.   On: 11/08/2023 02:43    Procedures Procedures   Medications Ordered in ED Medications - No data to display  ED Course/ Medical Decision Making/ A&P  Medical Decision Making  43 year old female presents for evaluation.  Please see HPI for further details.  On examination patient is afebrile and nontachycardic.  Her lung sounds are clear bilaterally, she is not a pox.  Abdomen is soft and compressible.  Neurological examinations at baseline.  Overall the patient is nontoxic in appearance.  Patient reports that pain is reproducible with palpation.  Suspect possibly costochondritis the source of patient pain.  Patient is PERC negative.  Will collect labs to rule out ACS.  Patient CBC without leukocytosis, baseline hemoglobin.  Patient metabolic panel unremarkable.  Troponin 3, delta 3.  Chest x-ray unremarkable.  Does show possible atelectasis or infiltrate in the right lobe patient reports pain is on the left portion of chest, she has no white count elevation, she denies any cough or fever, she denies shortness of breath.  EKG nonischemic.  At this time, patient workup is unremarkable.  No  emergent condition in need of further workup.  Suspect patient symptoms secondary to costochondritis as she reports that pain is reproducible, ibuprofen improves the pain.  Have advised patient to continue with ibuprofen at home.  Have advised her to follow-up with PCP.  Have encouraged her to return with any new or worsening symptoms such as cough, fever and she voices understanding.  She all of her questions answered to her satisfaction.  She is stable to discharge.  Final Clinical Impression(s) / ED Diagnoses Final diagnoses:  Chest pain, unspecified type    Rx / DC Orders ED Discharge Orders     None         Al Decant, PA-C 11/08/23 0550    Loetta Rough, MD 11/08/23  0613  

## 2023-11-08 NOTE — Discharge Instructions (Addendum)
 It was a pleasure taking part in your care.  As discussed, your workup is reassuring.  Please follow-up with your PCP further care.  Please begin taking ibuprofen or Tylenol every 6 hours as needed for pain.  Please return to the ED with any new or worsening symptoms.  See attached work note.

## 2023-11-08 NOTE — ED Triage Notes (Signed)
 Chest pain since yesterday  pressure on the chest causes more pain  lmp none

## 2024-03-25 ENCOUNTER — Encounter: Payer: Self-pay | Admitting: Emergency Medicine

## 2024-03-25 ENCOUNTER — Ambulatory Visit
Admission: EM | Admit: 2024-03-25 | Discharge: 2024-03-25 | Disposition: A | Discharge status: Home / Self Care | Attending: Nurse Practitioner | Admitting: Nurse Practitioner

## 2024-03-25 DIAGNOSIS — R822 Biliuria: Secondary | ICD-10-CM

## 2024-03-25 DIAGNOSIS — R319 Hematuria, unspecified: Secondary | ICD-10-CM | POA: Insufficient documentation

## 2024-03-25 DIAGNOSIS — N3001 Acute cystitis with hematuria: Secondary | ICD-10-CM

## 2024-03-25 LAB — POCT URINE DIPSTICK
Glucose, UA: NEGATIVE mg/dL
Ketones, POC UA: NEGATIVE mg/dL
Nitrite, UA: POSITIVE — AB
Protein Ur, POC: >=300 mg/dL — AB
Spec Grav, UA: >=1.03 — AB (ref 1.010–1.025)
Urobilinogen, UA: 1 U/dL
pH, UA: 6 (ref 5.0–8.0)

## 2024-03-25 LAB — POCT URINE PREGNANCY: Preg Test, Ur: NEGATIVE

## 2024-03-25 MED ORDER — NITROFURANTOIN MONOHYD MACRO 100 MG PO CAPS: 100.0000 mg | ORAL_CAPSULE | Freq: Two times a day (BID) | ORAL | 0 refills | Status: DC

## 2024-03-25 NOTE — ED Provider Notes (Signed)
 [facility]    CSN: [id number] Arrival date & time: 03/25/24  1245      History   Chief Complaint Chief Complaint  Patient presents with   Hematuria   Abdominal Pain    HPI Caitlin Foster is a 43 y.o. female.   Discussed the use of AI scribe software for clinical note transcription with the patient, who gave verbal consent to proceed.   The patient presents with a chief complaint of blood in urine noticed this morning. The patient has a history of hysterectomy in 2023.  The patient first noticed blood-tinged urine this morning at work, which has since progressed to more red-colored urine. Associated symptoms include an ache in the lower abdomen, which was described as pain last night but has since reduced to a mild ache. The patient reports feeling more fatigued over the past few days but did not initially suspect a urinary tract infection. Last night, thinking she needed a bowel movement, the patient took 2 senna tablets, resulting in only a small bowel movement. The patient denies any current pelvic pain, urinary urgency, frequency, or low back pain. She reports experiencing urinary incontinence and wears incontinence pads. The patient mentions having chronic back pain but does not report any new or worsening pain in the sides or lower back. There is no history of fevers, nausea, or vomiting. The patient states that she had blood in her urine with the previous UTI which required two different antibiotics to treat.  The following portions of the patient's history were reviewed and updated as appropriate: allergies, current medications, past family history, past medical history, past social history, past surgical history, and problem list.    Past Medical History:  Diagnosis Date   Anxiety    04/08/22 Pt states that she does not take medications or see a doctor for anxiety & depression.   Depression    04/08/22 Pt states that she does not take medications or see a  doctor for anxiety or depression.   Dysmenorrhea    Pt follows with Dr. Rexene Hoit, LOV 01/23/22.   Headache    hx of headaches, usually occur w/ periods   Mixed hyperlipidemia    instructed by PCP to eat a healthy diet and exercise   Obesity (BMI 35.0-39.9 without comorbidity)    Pt follows w/ PCP Roselie Bishop Mood, NP, LOV 03/13/22 in Epic.   Scoliosis    Patient had surgery when she was 43 years old.   Vitamin D  deficiency 2019    Patient Active Problem List   Diagnosis Date Noted   Menorrhagia with regular cycle 04/22/2022   Class 2 severe obesity due to excess calories with serious comorbidity and body mass index (BMI) of 36.0 to 36.9 in adult [facility]) 06/10/2021   Mixed hyperlipidemia 08/25/2019   Former tobacco use 11/30/2017   Chronic back pain 09/24/2017   Headache 09/24/2017   Vitamin D  deficiency 09/24/2017   Depression, recurrent (HCC)    Hair loss 07/09/2012    Past Surgical History:  Procedure Laterality Date   ABDOMINAL HYSTERECTOMY  04/22/2022   BACK SURGERY  1996   Harrington Rods secondary to scoliosis   ROBOTIC ASSISTED TOTAL HYSTERECTOMY WITH BILATERAL SALPINGO OOPHERECTOMY N/A 04/22/2022   Procedure: XI ROBOTIC ASSISTED TOTAL HYSTERECTOMY WITH BILATERAL SALPINGectomy;  Surgeon: Hoit Rexene, MD;  Location: [facility];  Service: Gynecology;  Laterality: N/A;    OB History   No obstetric history on file.  Home Medications    Prior to Admission medications   Medication Sig Start Date End Date Taking? Authorizing Provider  acetaminophen  (TYLENOL ) 500 MG tablet Take 2 tablets (1,000 mg total) by mouth every 8 (eight) hours as needed. 04/22/22  Yes Rosalva Sawyer, MD  Multiple Vitamins-Minerals (MULTIVITAMIN WITH MINERALS) tablet Take 1 tablet by mouth daily. 06/10/21  Yes Nche, Roselie Rockford, NP  nitrofurantoin , macrocrystal-monohydrate, (MACROBID ) 100 MG capsule Take 1 capsule (100 mg total) by mouth 2 (two) times daily. 03/25/24  Yes Dorianne Perret,  Lucie, FNP  Vitamin D , Ergocalciferol , (DRISDOL ) 1.25 MG (50000 UNIT) CAPS capsule Take 1 capsule (50,000 Units total) by mouth every 7 (seven) days. 03/17/22  Yes Nche, Roselie Rockford, NP  cyclobenzaprine  (FLEXERIL ) 5 MG tablet Take 1 tablet (5 mg total) by mouth at bedtime. Patient not taking: Reported on 03/25/2024 06/03/23   Nche, Roselie Rockford, NP  ELDERBERRY PO Take by mouth. Patient not taking: Reported on 03/25/2024    [provider]  meloxicam  (MOBIC ) 15 MG tablet Take 1 tablet (15 mg total) by mouth daily. Patient not taking: Reported on 03/25/2024 07/14/23   Katheen Roselie Rockford, NP  ondansetron  (ZOFRAN ) 8 MG tablet 1 tablet as needed Orally Once a day for 30 day(s) Patient not taking: Reported on 03/25/2024 06/18/22   [provider]    Family History Family History  Problem Relation Age of Onset   Diabetes Mother    Depression Mother    Prostate cancer Father    Depression Maternal Aunt    Early death Paternal Aunt 40   Heart attack Paternal Aunt    Diabetes Maternal Grandmother    Early death Paternal Grandmother 48   BRCA 1/2 Neg Hx    Breast cancer Neg Hx     Social History Social History   Tobacco Use   Smoking status: Former    Current packs/day: 0.00    Average packs/day: 0.5 packs/day for 10.0 years (5.0 ttl pk-yrs)    Types: E-cigarettes, Cigarettes    Start date: 12/07/2011    Quit date: 12/06/2021    Years since quitting: 2.3   Smokeless tobacco: Never   Tobacco comments:    spoke newports, menthol   Vaping Use   Vaping status: Never Used  Substance Use Topics   Alcohol use: Not Currently    Comment: social   Drug use: No     Allergies   Levaquin  [levofloxacin in d5w], Levofloxacin, Sulfa antibiotics, and Sulfamethoxazole-trimethoprim   Review of Systems Review of Systems  Constitutional:  Positive for fatigue.  Gastrointestinal:  Positive for abdominal pain (lower abdominal pain last night but now more of a mild ache) and  nausea (a couple of days ago but none recently). Negative for vomiting.  Genitourinary:  Positive for frequency and hematuria. Negative for dysuria, menstrual problem (hx hysterectomy 2023) and vaginal discharge.       Hx urinary incontinence with some frequency that is unchanged from usual   Musculoskeletal:  Positive for back pain (hx chronic back pain; no change from usual). Negative for gait problem.  All other systems reviewed and are negative.    Physical Exam Triage Vital Signs ED Triage Vitals [03/25/24 1347]  Encounter Vitals Group     BP (!) 136/93     Girls Systolic BP Percentile      Girls Diastolic BP Percentile      Boys Systolic BP Percentile      Boys Diastolic BP Percentile      Pulse Rate  75     Resp 16     Temp 98.6 F (37 C)     Temp Source Oral     SpO2 97 %     Weight      Height      Head Circumference      Peak Flow      Pain Score 3     Pain Loc      Pain Education      Exclude from Growth Chart    No data found.  Updated Vital Signs BP (!) 136/93 (BP Location: Left Arm)   Pulse 75   Temp 98.6 F (37 C) (Oral)   Resp 16   LMP 04/11/2022 (Exact Date)   SpO2 97%   Visual Acuity Right Eye Distance:   Left Eye Distance:   Bilateral Distance:    Right Eye Near:   Left Eye Near:    Bilateral Near:     Physical Exam Vitals reviewed.  Constitutional:      General: She is awake. She is not in acute distress.    Appearance: Normal appearance. She is well-developed. She is not ill-appearing, toxic-appearing or diaphoretic.  HENT:     Head: Normocephalic.     Right Ear: Hearing normal.     Left Ear: Hearing normal.     Nose: Nose normal.     Mouth/Throat:     Mouth: Mucous membranes are moist.  Eyes:     General: Vision grossly intact.     Conjunctiva/sclera: Conjunctivae normal.  Cardiovascular:     Rate and Rhythm: Normal rate and regular rhythm.     Heart sounds: Normal heart sounds.  Pulmonary:     Effort: Pulmonary effort is  normal.     Breath sounds: Normal breath sounds and air entry.  Abdominal:     General: Bowel sounds are normal. There is no distension.     Palpations: Abdomen is soft.     Tenderness: There is abdominal tenderness (mild lower abdominal tenderness noted without any guarding or rebound). There is no right CVA tenderness or left CVA tenderness.  Musculoskeletal:        General: Normal range of motion.     Cervical back: Full passive range of motion without pain, normal range of motion and neck supple.  Skin:    General: Skin is warm and dry.  Neurological:     General: No focal deficit present.     Mental Status: She is alert and oriented to person, place, and time.  Psychiatric:        Speech: Speech normal.        Behavior: Behavior is cooperative.      UC Treatments / Results  Labs (all labs ordered are listed, but only abnormal results are displayed) Labs Reviewed  POCT URINE DIPSTICK - Abnormal; Notable for the following components:      Result Value   Color, UA red (*)    Clarity, UA cloudy (*)    Bilirubin, UA moderate (*)    Spec Grav, UA >=1.030 (*)    Blood, UA large (*)    Protein Ur, POC >=300 (*)    Nitrite, UA Positive (*)    Leukocytes, UA Trace (*)    All other components within normal limits  POCT URINE PREGNANCY - Normal  URINE CULTURE  COMPREHENSIVE METABOLIC PANEL WITH GFR    EKG   Radiology No results found.  Procedures Procedures (including critical care time)  Medications Ordered  in UC Medications - No data to display  Initial Impression / Assessment and Plan / UC Course  I have reviewed the triage vital signs and the nursing notes.  Pertinent labs & imaging results that were available during my care of the patient were reviewed by me and considered in my medical decision making (see chart for details).     The patient presents with gross hematuria and lower abdominal discomfort. Urinalysis revealed red, cloudy urine with moderate  bilirubin, large red blood cells, protein, nitrites, and leukocytes, consistent with a urinary tract infection. She reports several days of fatigue but denies fever, nausea, vomiting, pelvic pain, urgency, frequency, or new back pain, though she has chronic back discomfort. Past history includes hysterectomy in 2023 and urinary incontinence managed with pads. Treatment was initiated with nitrofurantoin  1 capsule twice daily for 5 days, and urine was sent for culture to confirm the infection and guide any additional therapy if needed. A CMP was ordered to evaluate liver function given the presence of bilirubin in the urine. She was counseled on supportive measures including avoiding caffeine , tea, carbonated drinks, and alcohol, staying well hydrated, emptying the bladder frequently, wiping front to back, changing pads often, and urinating after intercourse if sexually active. She was instructed to monitor symptoms closely, follow up with her primary care provider if symptoms persist after completing antibiotics, and seek emergency care for severe pain, fever, chills, body aches, nausea, vomiting, or passage of large clots.   Today's evaluation has revealed no signs of a dangerous process. Discussed diagnosis with patient and/or guardian. Patient and/or guardian aware of their diagnosis, possible red flag symptoms to watch out for and need for close follow up. Patient and/or guardian understands verbal and written discharge instructions. Patient and/or guardian comfortable with plan and disposition.  Patient and/or guardian has a clear mental status at this time, good insight into illness (after discussion and teaching) and has clear judgment to make decisions regarding their care  Documentation was completed with the aid of voice recognition software. Transcription may contain typographical errors.  Final Clinical Impressions(s) / UC Diagnoses   Final diagnoses:  Hematuria, unspecified type  Acute cystitis  with hematuria  Bilirubinuria     Discharge Instructions      You were seen today for blood in your urine and lower abdominal discomfort. Your urine test shows signs of a urinary tract infection. You have been prescribed an antibiotic called nitrofurantoin  (Macrobid ) to take twice daily for 5 days. It is very important that you take the medication as prescribed and finish the entire course even if you start to feel better. A urine culture was sent to confirm the type of bacteria, and your treatment may be adjusted if needed once results are available. A blood test was also ordered to check your liver function because of bilirubin noted in your urine.  To help with healing and prevent worsening infection, drink plenty of water , avoid caffeine , tea, carbonated drinks, and alcohol, and try to empty your bladder often. Practice proper hygiene by wiping front to back, change incontinence pads frequently, and if sexually active, urinate soon after intercourse. These measures can help reduce irritation and lower the risk of future infections.  Most symptoms should begin to improve within a few days of starting antibiotics. Follow up with your primary care provider if your symptoms are not improving by the end of your treatment course. Go to the emergency department right away if you develop severe abdominal or back pain,  fever, chills, body aches, nausea, vomiting, or pass large blood clots in your urine, as these may be signs of a more serious infection.      ED Prescriptions     Medication Sig Dispense Auth. Provider   nitrofurantoin , macrocrystal-monohydrate, (MACROBID ) 100 MG capsule Take 1 capsule (100 mg total) by mouth 2 (two) times daily. 10 capsule Iola Lukes, FNP      PDMP not reviewed this encounter.   Iola Lukes, [place] 03/25/24 1435

## 2024-03-25 NOTE — ED Triage Notes (Signed)
 Pt reports hematuria that started this morning. Also notes suprapubic abdominal pain that started last night. Denies dysuria, frequency, urgency, fevers, and flank pain. Notes she has struggled with urinary incontinence since her hysterectomy (56yrs ago) and wears pads. She wonders if pads could be irritating the area. No relief with tylenol  and ibuprofen .

## 2024-03-25 NOTE — Discharge Instructions (Addendum)
 You were seen today for blood in your urine and lower abdominal discomfort. Your urine test shows signs of a urinary tract infection. You have been prescribed an antibiotic called nitrofurantoin  (Macrobid ) to take twice daily for 5 days. It is very important that you take the medication as prescribed and finish the entire course even if you start to feel better. A urine culture was sent to confirm the type of bacteria, and your treatment may be adjusted if needed once results are available. A blood test was also ordered to check your liver function because of bilirubin noted in your urine.  To help with healing and prevent worsening infection, drink plenty of water , avoid caffeine , tea, carbonated drinks, and alcohol, and try to empty your bladder often. Practice proper hygiene by wiping front to back, change incontinence pads frequently, and if sexually active, urinate soon after intercourse. These measures can help reduce irritation and lower the risk of future infections.  Most symptoms should begin to improve within a few days of starting antibiotics. Follow up with your primary care provider if your symptoms are not improving by the end of your treatment course. Go to the emergency department right away if you develop severe abdominal or back pain, fever, chills, body aches, nausea, vomiting, or pass large blood clots in your urine, as these may be signs of a more serious infection.

## 2024-03-26 LAB — COMPREHENSIVE METABOLIC PANEL WITH GFR
ALT: 9 IU/L (ref 0–32)
AST: 16 IU/L (ref 0–40)
Albumin: 4.3 g/dL (ref 3.9–4.9)
Alkaline Phosphatase: 95 IU/L (ref 44–121)
BUN/Creatinine Ratio: 11 (ref 9–23)
BUN: 9 mg/dL (ref 6–24)
Bilirubin Total: 0.3 mg/dL (ref 0.0–1.2)
CO2: 22 mmol/L (ref 20–29)
Calcium: 9.6 mg/dL (ref 8.7–10.2)
Chloride: 102 mmol/L (ref 96–106)
Creatinine, Ser: 0.85 mg/dL (ref 0.57–1.00)
Globulin, Total: 3.1 g/dL (ref 1.5–4.5)
Glucose: 83 mg/dL (ref 70–99)
Potassium: 4.2 mmol/L (ref 3.5–5.2)
Sodium: 139 mmol/L (ref 134–144)
Total Protein: 7.4 g/dL (ref 6.0–8.5)
eGFR: 88 mL/min/1.73 (ref >59)

## 2024-03-27 LAB — URINE CULTURE: Culture: >=100000 — AB

## 2024-03-28 ENCOUNTER — Ambulatory Visit (HOSPITAL_COMMUNITY): Payer: Self-pay

## 2024-03-31 ENCOUNTER — Other Ambulatory Visit: Payer: Self-pay

## 2024-03-31 ENCOUNTER — Ambulatory Visit
Admission: RE | Admit: 2024-03-31 | Discharge: 2024-03-31 | Disposition: A | Discharge status: Home / Self Care | Source: Ambulatory Visit

## 2024-03-31 ENCOUNTER — Encounter: Payer: Self-pay | Admitting: *Deleted

## 2024-03-31 ENCOUNTER — Ambulatory Visit
Admission: RE | Admit: 2024-03-31 | Discharge: 2024-03-31 | Disposition: A | Discharge status: Home / Self Care | Source: Ambulatory Visit | Attending: Physician Assistant | Admitting: Physician Assistant

## 2024-03-31 DIAGNOSIS — N898 Other specified noninflammatory disorders of vagina: Secondary | ICD-10-CM | POA: Diagnosis not present

## 2024-03-31 MED ORDER — FLUCONAZOLE 150 MG PO TABS: ORAL_TABLET | ORAL | 0 refills | Status: DC

## 2024-03-31 NOTE — ED Triage Notes (Signed)
 Treated for UTI 8/22. States completed antibiotics. Now having sx of yeast infection and raised bumps on left buttock

## 2024-03-31 NOTE — ED Provider Notes (Signed)
 [facility]    CSN: [id number] Arrival date & time: 03/31/24  1658      History   Chief Complaint No chief complaint on file.   HPI Caitlin Foster is a 43 y.o. female.   Here today for vaginal itching.  She states that she was recently treated for UTI and has completed antibiotics for same.  She denies any discharge.  She has not been sexually active in 2 years.  She reports some bumps to her buttocks but is not sure if this is related to possible yeast.  The history is provided by the patient.    Past Medical History:  Diagnosis Date   Anxiety    04/08/22 Pt states that she does not take medications or see a doctor for anxiety & depression.   Depression    04/08/22 Pt states that she does not take medications or see a doctor for anxiety or depression.   Dysmenorrhea    Pt follows with Dr. Rexene Hoit, LOV 01/23/22.   Headache    hx of headaches, usually occur w/ periods   Mixed hyperlipidemia    instructed by PCP to eat a healthy diet and exercise   Obesity (BMI 35.0-39.9 without comorbidity)    Pt follows w/ PCP Roselie Bishop Mood, NP, LOV 03/13/22 in Epic.   Scoliosis    Patient had surgery when she was 43 years old.   Vitamin D  deficiency 2019    Patient Active Problem List   Diagnosis Date Noted   Menorrhagia with regular cycle 04/22/2022   Class 2 severe obesity due to excess calories with serious comorbidity and body mass index (BMI) of 36.0 to 36.9 in adult [facility]) 06/10/2021   Mixed hyperlipidemia 08/25/2019   Former tobacco use 11/30/2017   Chronic back pain 09/24/2017   Headache 09/24/2017   Vitamin D  deficiency 09/24/2017   Depression, recurrent (HCC)    Hair loss 07/09/2012    Past Surgical History:  Procedure Laterality Date   ABDOMINAL HYSTERECTOMY  04/22/2022   BACK SURGERY  1996   Harrington Rods secondary to scoliosis   ROBOTIC ASSISTED TOTAL HYSTERECTOMY WITH BILATERAL SALPINGO OOPHERECTOMY N/A 04/22/2022   Procedure: XI ROBOTIC  ASSISTED TOTAL HYSTERECTOMY WITH BILATERAL SALPINGectomy;  Surgeon: Hoit Rexene, MD;  Location: [facility];  Service: Gynecology;  Laterality: N/A;    OB History   No obstetric history on file.      Home Medications    Prior to Admission medications   Medication Sig Start Date End Date Taking? Authorizing Provider  acetaminophen  (TYLENOL ) 500 MG tablet Take 2 tablets (1,000 mg total) by mouth every 8 (eight) hours as needed. 04/22/22  Yes Hoit Rexene, MD  fluconazole  (DIFLUCAN ) 150 MG tablet Take one tab PO today and repeat dose in 3 days if symptoms persist 03/31/24  Yes Billy Asberry FALCON, PA-C  Multiple Vitamins-Minerals (MULTIVITAMIN WITH MINERALS) tablet Take 1 tablet by mouth daily. 06/10/21  Yes Nche, Roselie Bishop, NP  Vitamin D , Ergocalciferol , (DRISDOL ) 1.25 MG (50000 UNIT) CAPS capsule Take 1 capsule (50,000 Units total) by mouth every 7 (seven) days. 03/17/22  Yes Nche, Roselie Bishop, NP  cyclobenzaprine  (FLEXERIL ) 5 MG tablet Take 1 tablet (5 mg total) by mouth at bedtime. Patient not taking: Reported on 03/25/2024 06/03/23   Nche, Roselie Bishop, NP  ELDERBERRY PO Take by mouth. Patient not taking: Reported on 03/25/2024    [provider]  meloxicam  (MOBIC ) 15 MG tablet Take 1 tablet (15 mg total)  by mouth daily. Patient not taking: Reported on 03/25/2024 07/14/23   Nche, Roselie Rockford, NP  ondansetron  (ZOFRAN ) 8 MG tablet 1 tablet as needed Orally Once a day for 30 day(s) Patient not taking: Reported on 03/25/2024 06/18/22   [provider]    Family History Family History  Problem Relation Age of Onset   Diabetes Mother    Depression Mother    Prostate cancer Father    Depression Maternal Aunt    Early death Paternal Aunt 28   Heart attack Paternal Aunt    Diabetes Maternal Grandmother    Early death Paternal Grandmother 30   BRCA 1/2 Neg Hx    Breast cancer Neg Hx     Social History Social History   Tobacco Use   Smoking status:  Former    Current packs/day: 0.00    Average packs/day: 0.5 packs/day for 10.0 years (5.0 ttl pk-yrs)    Types: E-cigarettes, Cigarettes    Start date: 12/07/2011    Quit date: 12/06/2021    Years since quitting: 2.3   Smokeless tobacco: Never   Tobacco comments:    spoke newports, menthol   Vaping Use   Vaping status: Never Used  Substance Use Topics   Alcohol use: Not Currently    Comment: social   Drug use: No     Allergies   Levaquin  [levofloxacin in d5w], Levofloxacin, Sulfa antibiotics, and Sulfamethoxazole-trimethoprim   Review of Systems Review of Systems  Constitutional:  Negative for chills and fever.  Eyes:  Negative for discharge and redness.  Respiratory:  Negative for shortness of breath.   Gastrointestinal:  Negative for abdominal pain, nausea and vomiting.  Genitourinary:  Negative for vaginal discharge.     Physical Exam Triage Vital Signs ED Triage Vitals  Encounter Vitals Group     BP      Girls Systolic BP Percentile      Girls Diastolic BP Percentile      Boys Systolic BP Percentile      Boys Diastolic BP Percentile      Pulse      Resp      Temp      Temp src      SpO2      Weight      Height      Head Circumference      Peak Flow      Pain Score      Pain Loc      Pain Education      Exclude from Growth Chart    No data found.  Updated Vital Signs BP 135/88 (BP Location: Left Arm)   Pulse 89   Temp 98 F (36.7 C)   Resp 16   LMP 04/11/2022 (Exact Date)   SpO2 96%   Visual Acuity Right Eye Distance:   Left Eye Distance:   Bilateral Distance:    Right Eye Near:   Left Eye Near:    Bilateral Near:     Physical Exam Vitals and nursing note reviewed.  Constitutional:      General: She is not in acute distress.    Appearance: Normal appearance. She is not ill-appearing.  HENT:     Head: Normocephalic and atraumatic.  Eyes:     Conjunctiva/sclera: Conjunctivae normal.  Cardiovascular:     Rate and Rhythm: Normal rate.   Pulmonary:     Effort: Pulmonary effort is normal. No respiratory distress.  Neurological:     Mental Status: She is  alert.  Psychiatric:        Mood and Affect: Mood normal.        Behavior: Behavior normal.        Thought Content: Thought content normal.      UC Treatments / Results  Labs (all labs ordered are listed, but only abnormal results are displayed) Labs Reviewed - No data to display  EKG   Radiology No results found.  Procedures Procedures (including critical care time)  Medications Ordered in UC Medications - No data to display  Initial Impression / Assessment and Plan / UC Course  I have reviewed the triage vital signs and the nursing notes.  Pertinent labs & imaging results that were available during my care of the patient were reviewed by me and considered in my medical decision making (see chart for details).    Will treat to cover suspected yeast vaginitis with Diflucan .  Advised follow-up if no gradual improvement of symptoms or with any further concerns.  Final Clinical Impressions(s) / UC Diagnoses   Final diagnoses:  Vaginal itching   Discharge Instructions   None    ED Prescriptions     Medication Sig Dispense Auth. Provider   fluconazole  (DIFLUCAN ) 150 MG tablet Take one tab PO today and repeat dose in 3 days if symptoms persist 2 tablet Billy Asberry FALCON, PA-C      PDMP not reviewed this encounter.   Billy Asberry FALCON, PA-C 03/31/24 [phone]

## 2024-05-30 ENCOUNTER — Other Ambulatory Visit: Payer: Self-pay | Admitting: Nurse Practitioner

## 2024-05-30 DIAGNOSIS — Z1231 Encounter for screening mammogram for malignant neoplasm of breast: Secondary | ICD-10-CM

## 2024-06-08 ENCOUNTER — Ambulatory Visit (INDEPENDENT_AMBULATORY_CARE_PROVIDER_SITE_OTHER): Admitting: Nurse Practitioner

## 2024-06-08 ENCOUNTER — Encounter: Payer: Self-pay | Admitting: Nurse Practitioner

## 2024-06-08 VITALS — BP 132/76 | HR 77 | Temp 97.9°F | Ht 66.5 in | Wt 232.0 lb

## 2024-06-08 DIAGNOSIS — Z0001 Encounter for general adult medical examination with abnormal findings: Secondary | ICD-10-CM

## 2024-06-08 DIAGNOSIS — E782 Mixed hyperlipidemia: Secondary | ICD-10-CM

## 2024-06-08 LAB — LIPID PANEL
Cholesterol: 212 mg/dL — ABNORMAL HIGH (ref 0–200)
HDL: 45.9 mg/dL (ref >39.00)
LDL Cholesterol: 146 mg/dL — ABNORMAL HIGH (ref 0–99)
NonHDL: 166.48
Total CHOL/HDL Ratio: 5
Triglycerides: 100 mg/dL (ref 0.0–149.0)
VLDL: 20 mg/dL (ref 0.0–40.0)

## 2024-06-08 NOTE — Assessment & Plan Note (Signed)
 Repeat lipid panel Advised about importance of mediterranean diet

## 2024-06-08 NOTE — Patient Instructions (Signed)
 Go to lab Maintain Heart healthy diet and daily exercise.  Mediterranean Diet A Mediterranean diet is based on the traditions of countries on the [employer]. It focuses on eating more: Fruits and vegetables. Whole grains, beans, nuts, and seeds. Heart-healthy fats. These are fats that are good for your heart. It involves eating less: Dairy. Meat and eggs. Processed foods with added sugar, salt, and fat. This type of diet can help prevent certain conditions. It can also improve outcomes if you have a long-term (chronic) disease, such as kidney or heart disease. What are tips for following this plan? Reading food labels Check packaged foods for: The serving size. For foods such as rice and pasta, the serving size is the amount of cooked product, not dry. The total fat. Avoid foods with saturated fat or trans fat. Added sugars, such as corn syrup. Shopping  Try to have a balanced diet. Buy a variety of foods, such as: Fresh fruits and vegetables. You may be able to get these from local farmers markets. You can also buy them frozen. Grains, beans, nuts, and seeds. Some of these can be bought in bulk. Fresh seafood. Poultry and eggs. Low-fat dairy products. Buy whole ingredients instead of foods that have already been packaged. If you can't get fresh seafood, buy precooked frozen shrimp or canned fish, such as tuna, salmon, or sardines. Stock your pantry so you always have certain foods on hand, such as olive oil, canned tuna, canned tomatoes, rice, pasta, and beans. Cooking Cook foods with extra-virgin olive oil instead of using butter or other vegetable oils. Have meat as a side dish. Have vegetables or grains as your main dish. This means having meat in small portions or adding small amounts of meat to foods like pasta or stew. Use beans or vegetables instead of meat in common dishes like chili or lasagna. Try out different cooking methods. Try roasting, broiling, steaming, and  sauting vegetables. Add frozen vegetables to soups, stews, pasta, or rice. Add nuts or seeds for added healthy fats and plant protein at each meal. You can add these to yogurt, salads, or vegetable dishes. Marinate fish or vegetables using olive oil, lemon juice, garlic, and fresh herbs. Meal planning Plan to eat a vegetarian meal one day each week. Try to work up to two vegetarian meals, if possible. Eat seafood two or more times a week. Have healthy snacks on hand. These may include: Vegetable sticks with hummus. Greek yogurt. Fruit and nut trail mix. Eat balanced meals. These should include: Fruit: 2-3 servings a day. Vegetables: 4-5 servings a day. Low-fat dairy: 2 servings a day. Fish, poultry, or lean meat: 1 serving a day. Beans and legumes: 2 or more servings a week. Nuts and seeds: 1-2 servings a day. Whole grains: 6-8 servings a day. Extra-virgin olive oil: 3-4 servings a day. Limit red meat and sweets to just a few servings a month. Lifestyle  Try to cook and eat meals with your family. Drink enough fluid to keep your pee (urine) pale yellow. Be active every day. This includes: Aerobic exercise, which is exercise that causes your heart to beat faster. Examples include running and swimming. Leisure activities like gardening, walking, or housework. Get 7-8 hours of sleep each night. Drink red wine if your provider says you can. A glass of wine is 5 oz (150 mL). You may be allowed to have: Up to 1 glass a day if you're female and not pregnant. Up to 2 glasses a day if  you're female. What foods should I eat? Fruits Apples. Apricots. Avocado. Berries. Bananas. Cherries. Dates. Figs. Grapes. Lemons. Melon. Oranges. Peaches. Plums. Pomegranate. Vegetables Artichokes. Beets. Broccoli. Cabbage. Carrots. Eggplant. Green beans. Chard. Kale. Spinach. Onions. Leeks. Peas. Squash. Tomatoes. Peppers. Radishes. Grains Whole-grain pasta. Brown rice. Bulgur wheat. Polenta. Couscous.  Whole-wheat bread. Dwyane Glad. Meats and other proteins Beans. Almonds. Sunflower seeds. Pine nuts. Peanuts. Cod. Salmon. Scallops. Shrimp. Tuna. Tilapia. Clams. Oysters. Eggs. Chicken or [place] without skin. Dairy Low-fat milk. Cheese. Greek yogurt. Fats and oils Extra-virgin olive oil. Avocado oil. Grapeseed oil. Beverages Water. Red wine. Herbal tea. Sweets and desserts Greek yogurt with honey. Baked apples. Poached pears. Trail mix. Seasonings and condiments Basil. Cilantro. Coriander. Cumin. Mint. Parsley. Sage. Rosemary. Tarragon. Garlic. Oregano. Thyme. Pepper. Balsamic vinegar. Tahini. Hummus. Tomato sauce. Olives. Mushrooms. The items listed above may not be all the foods and drinks you can have. Talk to a dietitian to learn more. What foods should I limit? This is a list of foods that should be eaten rarely. Fruits Fruit canned in syrup. Vegetables Deep-fried potatoes, like [place] fries. Grains Packaged pasta or rice dishes. Cereal with added sugar. Snacks with added sugar. Meats and other proteins Beef. Pork. Lamb. Chicken or [place] with skin. Hot dogs. Helene Loader. Dairy Ice cream. Sour cream. Whole milk. Fats and oils Butter. Canola oil. Vegetable oil. Beef fat (tallow). Lard. Beverages Juice. Sugar-sweetened soft drinks. Beer. Liquor and spirits. Sweets and desserts Cookies. Cakes. Pies. Candy. Seasonings and condiments Mayonnaise. Pre-made sauces and marinades. The items listed above may not be all the foods and drinks you should limit. Talk to a dietitian to learn more. Where to find more information American Heart Association (AHA): heart.org This information is not intended to replace advice given to you by your health care provider. Make sure you discuss any questions you have with your health care provider. Document Revised: 11/02/2022 Document Reviewed: 11/02/2022 Elsevier Patient Education  2024 [employer].

## 2024-06-08 NOTE — Progress Notes (Signed)
 Complete physical exam  Patient: Caitlin Foster   DOB: [birth year]   43 y.o. Female  MRN: [id number] Visit Date: 06/08/2024  Subjective:    Chief Complaint  Patient presents with   Annual Exam    FASTING    Caitlin Foster is a 43 y.o. female who presents today for a complete physical exam. She reports consuming a general diet. No consistent exercise She generally feels well. She reports sleeping well. She does not have additional problems to discuss today.  Vision:Yes Dental:No STD Screen:No  BP Readings from Last 3 Encounters:  06/08/24 132/76  03/31/24 135/88  03/25/24 (!) 136/93   Wt Readings from Last 3 Encounters:  06/08/24 232 lb (105.2 kg)  11/08/23 226 lb 10.1 oz (102.8 kg)  06/03/23 226 lb 9.6 oz (102.8 kg)   Most recent fall risk assessment:    06/08/2024    8:15 AM  Fall Risk   Falls in the past year? 0  Injury with Fall? 0  Risk for fall due to : No Fall Risks  Follow up Falls evaluation completed   Depression screen:Yes - No Depression Most recent depression screenings:    06/08/2024    8:15 AM 06/03/2023    8:17 AM  PHQ 2/9 Scores  PHQ - 2 Score 1 2  PHQ- 9 Score 3 4    HPI  Mixed hyperlipidemia Repeat lipid panel Advised about importance of mediterranean diet   Past Medical History:  Diagnosis Date   Anxiety    04/08/22 Pt states that she does not take medications or see a doctor for anxiety & depression.   Depression    04/08/22 Pt states that she does not take medications or see a doctor for anxiety or depression.   Dysmenorrhea    Pt follows with Dr. Rexene Hoit, LOV 01/23/22.   Headache    hx of headaches, usually occur w/ periods   Mixed hyperlipidemia    instructed by PCP to eat a healthy diet and exercise   Obesity (BMI 35.0-39.9 without comorbidity)    Pt follows w/ PCP Roselie Bishop Mood, NP, LOV 03/13/22 in Epic.   Scoliosis    Patient had surgery when she was 43 years old.   Vitamin D  deficiency 2019   Past Surgical  History:  Procedure Laterality Date   ABDOMINAL HYSTERECTOMY  04/22/2022   BACK SURGERY  1996   Harrington Rods secondary to scoliosis   ROBOTIC ASSISTED TOTAL HYSTERECTOMY WITH BILATERAL SALPINGO OOPHERECTOMY N/A 04/22/2022   Procedure: XI ROBOTIC ASSISTED TOTAL HYSTERECTOMY WITH BILATERAL SALPINGectomy;  Surgeon: Hoit Rexene, MD;  Location: [facility];  Service: Gynecology;  Laterality: N/A;   Social History   Socioeconomic History   Marital status: Single    Spouse name: Not on file   Number of children: 2   Years of education: Not on file   Highest education level: Bachelor's degree (e.g., BA, AB, BS)  Occupational History   Occupation: LPN  Tobacco Use   Smoking status: Former    Current packs/day: 0.00    Average packs/day: 0.5 packs/day for 10.0 years (5.0 ttl pk-yrs)    Types: E-cigarettes, Cigarettes    Start date: 12/07/2011    Quit date: 12/06/2021    Years since quitting: 2.5   Smokeless tobacco: Never   Tobacco comments:    spoke newports, menthol   Vaping Use   Vaping status: Never Used  Substance and Sexual Activity   Alcohol use: Not Currently  Comment: social   Drug use: No   Sexual activity: Not on file  Other Topics Concern   Not on file  Social History Narrative   Not on file   Social Drivers of Health   Financial Resource Strain: Low Risk  (06/07/2024)   Overall Financial Resource Strain (CARDIA)    Difficulty of Paying Living Expenses: Not very hard  Food Insecurity: No Food Insecurity (06/07/2024)   Hunger Vital Sign    Worried About Running Out of Food in the Last Year: Never true    Ran Out of Food in the Last Year: Never true  Transportation Needs: No Transportation Needs (06/07/2024)   PRAPARE - Administrator, Civil Service (Medical): No    Lack of Transportation (Non-Medical): No  Physical Activity: Insufficiently Active (06/07/2024)   Exercise Vital Sign    Days of Exercise per Week: 3 days    Minutes of  Exercise per Session: 30 min  Stress: Stress Concern Present (06/07/2024)   [employer] of Occupational Health - Occupational Stress Questionnaire    Feeling of Stress: To some extent  Social Connections: Moderately Integrated (06/07/2024)   Social Connection and Isolation Panel    Frequency of Communication with Friends and Family: More than three times a week    Frequency of Social Gatherings with Friends and Family: Twice a week    Attends Religious Services: More than 4 times per year    Active Member of [employer] or Organizations: Yes    Attends Engineer, Structural: More than 4 times per year    Marital Status: Never married  Catering Manager Violence: Not on file   Family Status  Relation Name Status   Mother  Alive   Father  Alive   Mat Aunt  (Not Specified)   Pat Aunt  Deceased   MGM  (Not Specified)   PGM  Deceased   Neg Hx  (Not Specified)  No partnership data on file   Family History  Problem Relation Age of Onset   Diabetes Mother    Depression Mother    Prostate cancer Father    Depression Maternal Aunt    Early death Paternal Aunt 50   Heart attack Paternal Aunt    Diabetes Maternal Grandmother    Early death Paternal Grandmother 30   BRCA 1/2 Neg Hx    Breast cancer Neg Hx    Allergies  Allergen Reactions   Levaquin  [Levofloxacin In D5w] Itching   Levofloxacin Hives and Itching   Sulfa Antibiotics Rash   Sulfamethoxazole-Trimethoprim Rash    Patient Care Team: Chiyeko Ferre, Roselie Rockford, NP as PCP - General (Internal Medicine)   Medications: Outpatient Medications Prior to Visit  Medication Sig   acetaminophen  (TYLENOL ) 500 MG tablet Take 2 tablets (1,000 mg total) by mouth every 8 (eight) hours as needed.   Multiple Vitamins-Minerals (MULTIVITAMIN WITH MINERALS) tablet Take 1 tablet by mouth daily.   [DISCONTINUED] cyclobenzaprine  (FLEXERIL ) 5 MG tablet Take 1 tablet (5 mg total) by mouth at bedtime. (Patient not taking: Reported on 03/25/2024)    [DISCONTINUED] ELDERBERRY PO Take by mouth. (Patient not taking: Reported on 03/25/2024)   [DISCONTINUED] fluconazole  (DIFLUCAN ) 150 MG tablet Take one tab PO today and repeat dose in 3 days if symptoms persist   [DISCONTINUED] meloxicam  (MOBIC ) 15 MG tablet Take 1 tablet (15 mg total) by mouth daily. (Patient not taking: Reported on 03/25/2024)   [DISCONTINUED] ondansetron  (ZOFRAN ) 8 MG tablet 1 tablet as needed Orally  Once a day for 30 day(s) (Patient not taking: Reported on 03/25/2024)   [DISCONTINUED] Vitamin D , Ergocalciferol , (DRISDOL ) 1.25 MG (50000 UNIT) CAPS capsule Take 1 capsule (50,000 Units total) by mouth every 7 (seven) days.   No facility-administered medications prior to visit.    Review of Systems  Constitutional:  Negative for activity change, appetite change and unexpected weight change.  Respiratory: Negative.    Cardiovascular: Negative.   Gastrointestinal: Negative.   Endocrine: Negative for cold intolerance and heat intolerance.  Genitourinary: Negative.   Musculoskeletal: Negative.   Skin: Negative.   Neurological: Negative.   Hematological: Negative.   Psychiatric/Behavioral:  Negative for behavioral problems, decreased concentration, dysphoric mood, hallucinations, self-injury, sleep disturbance and suicidal ideas. The patient is not nervous/anxious.        Objective:  BP 132/76 (BP Location: Right Arm, Patient Position: Sitting, Cuff Size: Large)   Pulse 77   Temp 97.9 F (36.6 C) (Temporal)   Ht 5' 6.5 (1.689 m)   Wt 232 lb (105.2 kg)   LMP 04/11/2022 (Exact Date)   SpO2 98%   BMI 36.88 kg/m     Physical Exam Vitals and nursing note reviewed.  Constitutional:      General: She is not in acute distress.    Appearance: She is obese.  HENT:     Right Ear: Tympanic membrane, ear canal and external ear normal.     Left Ear: Tympanic membrane, ear canal and external ear normal.     Nose: Nose normal.  Eyes:     Extraocular Movements: Extraocular  movements intact.     Conjunctiva/sclera: Conjunctivae normal.     Pupils: Pupils are equal, round, and reactive to light.  Neck:     Thyroid : No thyroid  mass, thyromegaly or thyroid  tenderness.  Cardiovascular:     Rate and Rhythm: Normal rate and regular rhythm.     Pulses: Normal pulses.     Heart sounds: Normal heart sounds.  Pulmonary:     Effort: Pulmonary effort is normal.     Breath sounds: Normal breath sounds.  Abdominal:     General: Bowel sounds are normal.     Palpations: Abdomen is soft.  Musculoskeletal:        General: Normal range of motion.     Cervical back: Normal range of motion and neck supple.     Right lower leg: No edema.     Left lower leg: No edema.  Lymphadenopathy:     Cervical: No cervical adenopathy.  Skin:    General: Skin is warm and dry.  Neurological:     Mental Status: She is alert and oriented to person, place, and time.     Cranial Nerves: No cranial nerve deficit.  Psychiatric:        Mood and Affect: Mood normal.        Behavior: Behavior normal.        Thought Content: Thought content normal.      No results found for any visits on 06/08/24.    Assessment & Plan:    Routine Health Maintenance and Physical Exam  Immunization History  Administered Date(s) Administered   DTP 03/21/1983, 05/28/1983, 07/18/1983, 09/07/1984, 05/08/1987   Hep B, Unspecified 06/21/1998   Influenza, Seasonal, Injecte, Preservative Fre 09/10/2010   Influenza,inj,Quad PF,6+ Mos 10/06/2017   Influenza-Unspecified 08/27/2017, 05/04/2019, 05/02/2022, 05/08/2023, 04/28/2024   MMR 06/04/1984, 01/07/2001   OPV 05/08/1987   PFIZER Comirnaty(Gray Top)Covid-19 Tri-Sucrose Vaccine 10/22/2020   PFIZER(Purple Top)SARS-COV-2 Vaccination 02/28/2020, 03/20/2020,  10/22/2020   Polio, Unspecified 03/21/1983, 05/28/1983, 07/18/1983   Tdap 06/03/2023   Health Maintenance  Topic Date Due   Hepatitis B Vaccines 19-59 Average Risk (1 of 3 - 19+ 3-dose series) Never done    COVID-19 Vaccine (5 - 2025-26 season) 08/03/2024 (Originally 04/04/2024)   HPV VACCINES (1 - 3-dose SCDM series) 06/08/2025 (Originally 01/19/2010)   Hepatitis C Screening  06/08/2025 (Originally 01/19/2001)   Mammogram  06/04/2025   DTaP/Tdap/Td (7 - Td or Tdap) 06/02/2033   Influenza Vaccine  Completed   HIV Screening  Completed   Pneumococcal Vaccine  Aged Out   Meningococcal B Vaccine  Aged Out   Discussed health benefits of physical activity, and encouraged her to engage in regular exercise appropriate for her age and condition.  Problem List Items Addressed This Visit     Mixed hyperlipidemia   Repeat lipid panel Advised about importance of mediterranean diet      Relevant Orders   Lipid panel   TSH   Other Visit Diagnoses       Encounter for preventative adult health care exam with abnormal findings    -  Primary      Return in about 1 year (around 06/08/2025) for CPE (fasting).     Roselie Mood, NP

## 2024-06-09 LAB — TSH: TSH: 1.87 u[IU]/mL (ref 0.35–5.50)

## 2024-06-10 ENCOUNTER — Ambulatory Visit: Payer: Self-pay | Admitting: Nurse Practitioner

## 2024-06-20 ENCOUNTER — Ambulatory Visit
Admission: RE | Admit: 2024-06-20 | Discharge: 2024-06-20 | Disposition: A | Discharge status: Home / Self Care | Source: Ambulatory Visit | Attending: Nurse Practitioner | Admitting: Nurse Practitioner

## 2024-06-20 DIAGNOSIS — Z1231 Encounter for screening mammogram for malignant neoplasm of breast: Secondary | ICD-10-CM
